# Patient Record
Sex: Male | Born: 1935 | Race: White | Hispanic: No | Marital: Single | Smoking: Former smoker
Health system: Southern US, Community
[De-identification: ages and names within clinical notes are randomized; demographics above are authoritative.]

## PROBLEM LIST (undated history)

## (undated) DIAGNOSIS — Z974 Presence of external hearing-aid: Secondary | ICD-10-CM

## (undated) DIAGNOSIS — G4730 Sleep apnea, unspecified: Secondary | ICD-10-CM

## (undated) DIAGNOSIS — E7800 Pure hypercholesterolemia, unspecified: Secondary | ICD-10-CM

## (undated) DIAGNOSIS — C61 Malignant neoplasm of prostate: Secondary | ICD-10-CM

## (undated) DIAGNOSIS — Z972 Presence of dental prosthetic device (complete) (partial): Secondary | ICD-10-CM

## (undated) DIAGNOSIS — I10 Essential (primary) hypertension: Secondary | ICD-10-CM

## (undated) DIAGNOSIS — C801 Malignant (primary) neoplasm, unspecified: Secondary | ICD-10-CM

## (undated) HISTORY — PX: OTHER SURGICAL HISTORY: SHX169

## (undated) HISTORY — PX: HERNIA REPAIR: SHX51

---

## 1997-03-03 HISTORY — PX: PROSTATECTOMY: SHX69

## 2006-11-27 ENCOUNTER — Ambulatory Visit: Payer: Self-pay | Admitting: Family Medicine

## 2009-03-27 ENCOUNTER — Ambulatory Visit: Payer: Self-pay | Admitting: Otolaryngology

## 2009-11-15 ENCOUNTER — Ambulatory Visit: Payer: Self-pay | Admitting: Family Medicine

## 2009-12-04 ENCOUNTER — Ambulatory Visit: Payer: Self-pay | Admitting: Otolaryngology

## 2011-02-12 ENCOUNTER — Ambulatory Visit: Payer: Self-pay | Admitting: Otolaryngology

## 2011-09-23 DIAGNOSIS — I10 Essential (primary) hypertension: Secondary | ICD-10-CM | POA: Insufficient documentation

## 2011-11-29 ENCOUNTER — Ambulatory Visit: Payer: Self-pay | Admitting: Medical

## 2011-12-02 ENCOUNTER — Ambulatory Visit: Payer: Self-pay | Admitting: Internal Medicine

## 2011-12-09 ENCOUNTER — Ambulatory Visit: Payer: Self-pay | Admitting: Internal Medicine

## 2012-01-01 ENCOUNTER — Ambulatory Visit: Payer: Self-pay | Admitting: Internal Medicine

## 2012-07-03 ENCOUNTER — Ambulatory Visit: Payer: Self-pay

## 2012-09-06 ENCOUNTER — Ambulatory Visit: Payer: Self-pay | Admitting: Otolaryngology

## 2013-03-04 ENCOUNTER — Ambulatory Visit: Payer: Self-pay | Admitting: Pediatrics

## 2013-10-05 ENCOUNTER — Ambulatory Visit: Payer: Self-pay | Admitting: Ophthalmology

## 2013-11-16 ENCOUNTER — Ambulatory Visit: Payer: Self-pay | Admitting: Ophthalmology

## 2014-03-03 HISTORY — PX: CARDIAC CATHETERIZATION: SHX172

## 2014-05-15 DIAGNOSIS — F419 Anxiety disorder, unspecified: Secondary | ICD-10-CM | POA: Insufficient documentation

## 2014-05-18 DIAGNOSIS — I25119 Atherosclerotic heart disease of native coronary artery with unspecified angina pectoris: Secondary | ICD-10-CM | POA: Insufficient documentation

## 2014-07-13 ENCOUNTER — Telehealth: Payer: Self-pay | Admitting: *Deleted

## 2014-07-13 NOTE — Telephone Encounter (Signed)
Left vm that we got an order for Cardiac Rehab that his MD wanted him to have. (Need to discuss with patient if interested and get more details of dx chest pain to see if insurance will pay for CR).

## 2014-09-11 DIAGNOSIS — R0789 Other chest pain: Secondary | ICD-10-CM | POA: Insufficient documentation

## 2015-02-13 ENCOUNTER — Ambulatory Visit
Admission: EM | Admit: 2015-02-13 | Discharge: 2015-02-13 | Disposition: A | Discharge status: Home / Self Care | Payer: Medicare HMO | Attending: Family Medicine | Admitting: Family Medicine

## 2015-02-13 DIAGNOSIS — J069 Acute upper respiratory infection, unspecified: Secondary | ICD-10-CM

## 2015-02-13 HISTORY — DX: Malignant neoplasm of prostate: C61

## 2015-02-13 HISTORY — DX: Malignant (primary) neoplasm, unspecified: C80.1

## 2015-02-13 HISTORY — DX: Essential (primary) hypertension: I10

## 2015-02-13 HISTORY — DX: Pure hypercholesterolemia, unspecified: E78.00

## 2015-02-13 MED ORDER — AZITHROMYCIN 250 MG PO TABS: ORAL_TABLET | ORAL | Status: DC

## 2015-02-13 MED ORDER — HYDROCOD POLST-CPM POLST ER 10-8 MG/5ML PO SUER: 2.5000 mL | Freq: Every evening | ORAL | Status: DC | PRN

## 2015-02-13 NOTE — Discharge Instructions (Signed)
Cool Mist Vaporizers Vaporizers may help relieve the symptoms of a cough and cold. They add moisture to the air, which helps mucus to become thinner and less sticky. This makes it easier to breathe and cough up secretions. Cool mist vaporizers do not cause serious burns like hot mist vaporizers, which may also be called steamers or humidifiers. Vaporizers have not been proven to help with colds. You should not use a vaporizer if you are allergic to mold. HOME CARE INSTRUCTIONS  Follow the package instructions for the vaporizer.  Do not use anything other than distilled water in the vaporizer.  Do not run the vaporizer all of the time. This can cause mold or bacteria to grow in the vaporizer.  Clean the vaporizer after each time it is used.  Clean and dry the vaporizer well before storing it.  Stop using the vaporizer if worsening respiratory symptoms develop.   This information is not intended to replace advice given to you by your health care provider. Make sure you discuss any questions you have with your health care provider.   Document Released: 11/15/2003 Document Revised: 02/22/2013 Document Reviewed: 07/07/2012 Elsevier Interactive Patient Education 2016 Elsevier Inc.  Upper Respiratory Infection, Adult Most upper respiratory infections (URIs) are a viral infection of the air passages leading to the lungs. A URI affects the nose, throat, and upper air passages. The most common type of URI is nasopharyngitis and is typically referred to as "the common cold." URIs run their course and usually go away on their own. Most of the time, a URI does not require medical attention, but sometimes a bacterial infection in the upper airways can follow a viral infection. This is called a secondary infection. Sinus and middle ear infections are common types of secondary upper respiratory infections. Bacterial pneumonia can also complicate a URI. A URI can worsen asthma and chronic obstructive  pulmonary disease (COPD). Sometimes, these complications can require emergency medical care and may be life threatening.  CAUSES Almost all URIs are caused by viruses. A virus is a type of germ and can spread from one person to another.  RISKS FACTORS You may be at risk for a URI if:   You smoke.   You have chronic heart or lung disease.  You have a weakened defense (immune) system.   You are very young or very old.   You have nasal allergies or asthma.  You work in crowded or poorly ventilated areas.  You work in health care facilities or schools. SIGNS AND SYMPTOMS  Symptoms typically develop 2-3 days after you come in contact with a cold virus. Most viral URIs last 7-10 days. However, viral URIs from the influenza virus (flu virus) can last 14-18 days and are typically more severe. Symptoms may include:   Runny or stuffy (congested) nose.   Sneezing.   Cough.   Sore throat.   Headache.   Fatigue.   Fever.   Loss of appetite.   Pain in your forehead, behind your eyes, and over your cheekbones (sinus pain).  Muscle aches.  DIAGNOSIS  Your health care provider may diagnose a URI by:  Physical exam.  Tests to check that your symptoms are not due to another condition such as:  Strep throat.  Sinusitis.  Pneumonia.  Asthma. TREATMENT  A URI goes away on its own with time. It cannot be cured with medicines, but medicines may be prescribed or recommended to relieve symptoms. Medicines may help:  Reduce your fever.  Reduce  your cough.  Relieve nasal congestion. HOME CARE INSTRUCTIONS   Take medicines only as directed by your health care provider.   Gargle warm saltwater or take cough drops to comfort your throat as directed by your health care provider.  Use a warm mist humidifier or inhale steam from a shower to increase air moisture. This may make it easier to breathe.  Drink enough fluid to keep your urine clear or pale yellow.   Eat  soups and other clear broths and maintain good nutrition.   Rest as needed.   Return to work when your temperature has returned to normal or as your health care provider advises. You may need to stay home longer to avoid infecting others. You can also use a face mask and careful hand washing to prevent spread of the virus.  Increase the usage of your inhaler if you have asthma.   Do not use any tobacco products, including cigarettes, chewing tobacco, or electronic cigarettes. If you need help quitting, ask your health care provider. PREVENTION  The best way to protect yourself from getting a cold is to practice good hygiene.   Avoid oral or hand contact with people with cold symptoms.   Wash your hands often if contact occurs.  There is no clear evidence that vitamin C, vitamin E, echinacea, or exercise reduces the chance of developing a cold. However, it is always recommended to get plenty of rest, exercise, and practice good nutrition.  SEEK MEDICAL CARE IF:   You are getting worse rather than better.   Your symptoms are not controlled by medicine.   You have chills.  You have worsening shortness of breath.  You have brown or red mucus.  You have yellow or brown nasal discharge.  You have pain in your face, especially when you bend forward.  You have a fever.  You have swollen neck glands.  You have pain while swallowing.  You have white areas in the back of your throat. SEEK IMMEDIATE MEDICAL CARE IF:   You have severe or persistent:  Headache.  Ear pain.  Sinus pain.  Chest pain.  You have chronic lung disease and any of the following:  Wheezing.  Prolonged cough.  Coughing up blood.  A change in your usual mucus.  You have a stiff neck.  You have changes in your:  Vision.  Hearing.  Thinking.  Mood. MAKE SURE YOU:   Understand these instructions.  Will watch your condition.  Will get help right away if you are not doing well or  get worse.   This information is not intended to replace advice given to you by your health care provider. Make sure you discuss any questions you have with your health care provider.   Document Released: 08/13/2000 Document Revised: 07/04/2014 Document Reviewed: 05/25/2013 Elsevier Interactive Patient Education 2016 Elsevier Inc.  Cough, Adult Coughing is a reflex that clears your throat and your airways. Coughing helps to heal and protect your lungs. It is normal to cough occasionally, but a cough that happens with other symptoms or lasts a long time may be a sign of a condition that needs treatment. A cough may last only 2-3 weeks (acute), or it may last longer than 8 weeks (chronic). CAUSES Coughing is commonly caused by:  Breathing in substances that irritate your lungs.  A viral or bacterial respiratory infection.  Allergies.  Asthma.  Postnasal drip.  Smoking.  Acid backing up from the stomach into the esophagus (gastroesophageal reflux).  Certain medicines.  Chronic lung problems, including COPD (or rarely, lung cancer).  Other medical conditions such as heart failure. HOME CARE INSTRUCTIONS  Pay attention to any changes in your symptoms. Take these actions to help with your discomfort:  Take medicines only as told by your health care provider.  If you were prescribed an antibiotic medicine, take it as told by your health care provider. Do not stop taking the antibiotic even if you start to feel better.  Talk with your health care provider before you take a cough suppressant medicine.  Drink enough fluid to keep your urine clear or pale yellow.  If the air is dry, use a cold steam vaporizer or humidifier in your bedroom or your home to help loosen secretions.  Avoid anything that causes you to cough at work or at home.  If your cough is worse at night, try sleeping in a semi-upright position.  Avoid cigarette smoke. If you smoke, quit smoking. If you need help  quitting, ask your health care provider.  Avoid caffeine.  Avoid alcohol.  Rest as needed. SEEK MEDICAL CARE IF:   You have new symptoms.  You cough up pus.  Your cough does not get better after 2-3 weeks, or your cough gets worse.  You cannot control your cough with suppressant medicines and you are losing sleep.  You develop pain that is getting worse or pain that is not controlled with pain medicines.  You have a fever.  You have unexplained weight loss.  You have night sweats. SEEK IMMEDIATE MEDICAL CARE IF:  You cough up blood.  You have difficulty breathing.  Your heartbeat is very fast.   This information is not intended to replace advice given to you by your health care provider. Make sure you discuss any questions you have with your health care provider.   Document Released: 08/16/2010 Document Revised: 11/08/2014 Document Reviewed: 04/26/2014 Elsevier Interactive Patient Education [employer].

## 2015-02-13 NOTE — ED Provider Notes (Signed)
CSN: [id number]     Arrival date & time 02/13/15  1906 History   First MD Initiated Contact with Patient 02/13/15 1951     Chief Complaint  Patient presents with  . URI   (Consider location/radiation/quality/duration/timing/severity/associated sxs/prior Treatment) HPI   This 80 year old male who presents with a throat that started about 2 days ago but has gotten better although his left with some scratchy tickly feeling. He now has a cough that is persistent first was dry but is now become productive with yellow-green sputum. He's had no fever but has had chills. Coronary disease and is status post PTCA stenting. Afebrile today pulse is 64 respirations 16 blood pressure 130/76 O2 sat 98% on room air is a nonsmoker  Past Medical History  Diagnosis Date  . Hypertension   . Hypercholesteremia   . Cancer ([place])   . Prostate cancer [facility])    Past Surgical History  Procedure Laterality Date  . Prostatectomy  1999  . Hernia repair    . Cardiac stents     Family History  Problem Relation Age of Onset  . Heart attack Mother    Social History  Substance Use Topics  . Smoking status: Former Research scientist (life sciences)  . Smokeless tobacco: None  . Alcohol Use: No    Review of Systems  Constitutional: Positive for chills and activity change. Negative for fever and fatigue.  HENT: Positive for congestion, postnasal drip, rhinorrhea, sinus pressure and sore throat.   Respiratory: Positive for cough. Negative for shortness of breath, wheezing and stridor.   All other systems reviewed and are negative.   Allergies  Review of patient's allergies indicates no known allergies.  Home Medications   Prior to Admission medications   Medication Sig Start Date End Date Taking? Authorizing Provider  atorvastatin (LIPITOR) 40 MG tablet Take 40 mg by mouth daily.   Yes Historical Provider, MD  clopidogrel (PLAVIX) 75 MG tablet Take 75 mg by mouth daily.   Yes Historical Provider, MD  lisinopril (PRINIVIL,ZESTRIL)  10 MG tablet Take 10 mg by mouth daily.   Yes Historical Provider, MD  metoprolol tartrate (LOPRESSOR) 25 MG tablet Take 25 mg by mouth 2 (two) times daily.   Yes Historical Provider, MD  azithromycin (ZITHROMAX Z-PAK) 250 MG tablet Use as per package instructions 02/13/15   Lorin Picket, PA-C  chlorpheniramine-HYDROcodone [facility]) 10-8 MG/5ML SUER Take 2.5 mLs by mouth at bedtime as needed for cough. 02/13/15   Lorin Picket, PA-C   Meds Ordered and Administered this Visit  Medications - No data to display  BP 138/76 mmHg  Pulse 64  Temp(Src) 98.1 F (36.7 C) (Tympanic)  Resp 16  Ht 5\' 8"  (1.727 m)  Wt 170 lb (77.111 kg)  BMI 25.85 kg/m2  SpO2 98% No data found.   Physical Exam  Constitutional: He is oriented to person, place, and time. He appears well-developed and well-nourished. No distress.  HENT:  Head: Normocephalic and atraumatic.  Right ear is very sclerotic posteriorly and inferiorly  Eyes: Conjunctivae are normal. Pupils are equal, round, and reactive to light.  Neck: Normal range of motion. Neck supple.  Pulmonary/Chest: Effort normal and breath sounds normal. No stridor. No respiratory distress. He has no wheezes. He has no rales.  Musculoskeletal: Normal range of motion. He exhibits no edema or tenderness.  Lymphadenopathy:    He has no cervical adenopathy.  Neurological: He is alert and oriented to person, place, and time.  Skin: Skin is warm and  dry. He is not diaphoretic.  Psychiatric: He has a normal mood and affect. His behavior is normal. Judgment and thought content normal.  Nursing note and vitals reviewed.   ED Course  Procedures (including critical care time)  Labs Review Labs Reviewed - No data to display  Imaging Review No results found.   Visual Acuity Review  Right Eye Distance:   Left Eye Distance:   Bilateral Distance:    Right Eye Near:   Left Eye Near:    Bilateral Near:         MDM   1. URI, acute     Discharge Medication List as of 02/13/2015  8:06 PM    START taking these medications   Details  azithromycin (ZITHROMAX Z-PAK) 250 MG tablet Use as per package instructions, Print    chlorpheniramine-HYDROcodone (TUSSIONEX PENNKINETIC ER) 10-8 MG/5ML SUER Take 2.5 mLs by mouth at bedtime as needed for cough., Starting 02/13/2015, Until Discontinued, Print      Plan: 1. Diagnosis reviewed with patient 2. rx as per orders; risks, benefits, potential side effects reviewed with patient 3. Recommend supportive treatment with fluids and rest. Cardio [place] antibiotic at this point time because of his age as well as his coronary disease.  4. F/u in emergency room if her has any increase in his productive cough fever chills night sweats   Lorin Picket, PA-C 02/13/15 2019

## 2015-02-13 NOTE — ED Notes (Signed)
Started 2 days ago with sore throat which has now subsided, cough which has now become productive yellow/green. Denies fever

## 2015-07-17 DIAGNOSIS — Z85828 Personal history of other malignant neoplasm of skin: Secondary | ICD-10-CM | POA: Insufficient documentation

## 2016-04-17 DIAGNOSIS — Z9181 History of falling: Secondary | ICD-10-CM | POA: Insufficient documentation

## 2016-08-13 ENCOUNTER — Ambulatory Visit
Admission: EM | Admit: 2016-08-13 | Discharge: 2016-08-13 | Disposition: A | Discharge status: Home / Self Care | Payer: Medicare HMO | Attending: Family Medicine | Admitting: Family Medicine

## 2016-08-13 ENCOUNTER — Ambulatory Visit (INDEPENDENT_AMBULATORY_CARE_PROVIDER_SITE_OTHER): Payer: Medicare HMO

## 2016-08-13 ENCOUNTER — Encounter: Payer: Self-pay | Admitting: *Deleted

## 2016-08-13 DIAGNOSIS — M25511 Pain in right shoulder: Secondary | ICD-10-CM | POA: Diagnosis not present

## 2016-08-13 DIAGNOSIS — S43101A Unspecified dislocation of right acromioclavicular joint, initial encounter: Secondary | ICD-10-CM

## 2016-08-13 NOTE — Discharge Instructions (Signed)
Rest. Ice. Use sling for support. Stretch as discussed.   Follow up with orthopedic this week as discussed.   Follow up with your primary care physician this week as needed. Return to Urgent care for new or worsening concerns.

## 2016-08-13 NOTE — ED Provider Notes (Signed)
[facility] ____________________________________________  Time seen: Approximately 12:29 PM  I have reviewed the triage vital signs and the nursing notes.   HISTORY  Chief Complaint Shoulder Injury   HPI Nathan Daniels is a 82 y.o. male  presenting for evaluation of right shoulder pain post injury that occurred just prior to arrival. Patient states that he was moving things around at a club he attends, tripped causing him to fall to his right knee and hit his right shoulder against a beam. States only right knee hit the ground. Did not fall completely to the ground. Denies head injury or loss consciousness. Denies pain to his knee. Denies lower extremity pain. states initially he didn't have any pain to his right shoulder, and then noticed there is a knot present and he has mild pain only when directly pressing on the knot. Reports he still has full range of motion present. Denies any paresthesias, decrease hand grips or extremity pain or weakness. Denies complaints prior to fall today. States only fell due to tripping. Denies any neck or back pain or injury. Reports otherwise feels well. States minimal to mild pain at this time. No alleviating factors and prior to arrival.  Denies chest pain, shortness of breath, weakness, dizziness, abdominal pain, extremity swelling or rash. Denies recent sickness. Denies recent antibiotic use. Takes plavix.    Past Medical History:  Diagnosis Date  . Cancer ([place])   . Hypercholesteremia   . Hypertension   . Prostate cancer ([place])   Chronic cervical radicular pain-denies currently  There are no active problems to display for this patient.   Past Surgical History:  Procedure Laterality Date  . cardiac stents    . HERNIA REPAIR    . PROSTATECTOMY  1999     No current facility-administered medications for this encounter.   Current Outpatient Prescriptions:  .  atorvastatin (LIPITOR) 40 MG tablet, Take 40 mg by mouth daily., Disp:  , Rfl:  .  clopidogrel (PLAVIX) 75 MG tablet, Take 75 mg by mouth daily., Disp: , Rfl:  .  lisinopril (PRINIVIL,ZESTRIL) 10 MG tablet, Take 10 mg by mouth daily., Disp: , Rfl:  .  azithromycin (ZITHROMAX Z-PAK) 250 MG tablet, Use as per package instructions, Disp: 1 each, Rfl: 0 .  chlorpheniramine-HYDROcodone (TUSSIONEX PENNKINETIC ER) 10-8 MG/5ML SUER, Take 2.5 mLs by mouth at bedtime as needed for cough., Disp: 60 mL, Rfl: 0 .  metoprolol tartrate (LOPRESSOR) 25 MG tablet, Take 25 mg by mouth 2 (two) times daily., Disp: , Rfl:   Allergies Patient has no known allergies.  Family History  Problem Relation Age of Onset  . Heart attack Mother     Social History Social History  Substance Use Topics  . Smoking status: Former Research scientist (life sciences)  . Smokeless tobacco: Never Used  . Alcohol use No    Review of Systems Constitutional: No fever/chills Eyes: No visual changes. Cardiovascular: Denies chest pain. Respiratory: Denies shortness of breath. Gastrointestinal: No abdominal pain.  Musculoskeletal: Negative for back pain. As above. Skin: Negative for rash.   ____________________________________________   PHYSICAL EXAM:  VITAL SIGNS: ED Triage Vitals  Enc Vitals Group     BP 08/13/16 1117 (!) 141/92     Pulse Rate 08/13/16 1117 100     Resp 08/13/16 1117 16     Temp 08/13/16 1117 98 F (36.7 C)     Temp Source 08/13/16 1117 Oral     SpO2 08/13/16 1117 98 %     Weight 08/13/16  1118 170 lb (77.1 kg)     Height 08/13/16 1118 5\' 7"  (1.702 m)     Head Circumference --      Peak Flow --      Pain Score 08/13/16 1118 2     Pain Loc --      Pain Edu? --      Excl. in [place]? --     Constitutional: Alert and oriented. Well appearing and in no acute distress. ENT      Head: Normocephalic and atraumatic. Cardiovascular: Normal rate, regular rhythm. Grossly normal heart sounds.  Good peripheral circulation. Respiratory: Normal respiratory effort without tachypnea nor retractions. Breath  sounds are clear and equal bilaterally. No wheezes, rales, rhonchi. Musculoskeletal:  No midline cervical, thoracic or lumbar tenderness to palpation. Bilateral Distal radial pulses equal and easily palpated. ambulatory in room with steady gait. Changes positions quickly. No chest or rib tenderness to palpation.      Right lower leg:  No tenderness or edema.      Left lower leg:  No tenderness or edema.  Except : Mild point tenderness at right [facility] joint, with minimal ecchymosis, mild to moderate localized swelling, no bleeding, skin intact, no posterior scapular tenderness, no medial or mid clavicular tenderness, no humeral tenderness, right upper extremity able to abduct past 90, no pain with abduction, mild pain with empty can test, bilateral hand grips strong and equal, no motor or tendon deficit noted to bilateral hands.  Neurologic:  Normal speech and language. No gross focal neurologic deficits are appreciated. Speech is normal. No gait instability.  Skin:  Skin is warm, dry. Psychiatric: Mood and affect are normal. Speech and behavior are normal. Patient exhibits appropriate insight and judgment   ___________________________________________   LABS (all labs ordered are listed, but only abnormal results are displayed)  Labs Reviewed - No data to display ____________________________________________  RADIOLOGY  Dg Shoulder Right  Result Date: 08/13/2016 CLINICAL DATA:  Status post fall with right shoulder injury. The patient is complaining of discomfort over the [facility] joint. EXAM: RIGHT SHOULDER - 2+ VIEW COMPARISON:  Chest x-ray of March 04, 2013 FINDINGS: There is high-riding of the distal clavicle with respect to the acromion. There is calcification within the [facility] joint space that may lie within the soft tissues or be related to an avulsion fracture. There is narrowing of the subacromial subdeltoid space. There is mild degenerative spurring of the bony glenoid but the joint space is well  maintained. No acute humeral or scapular fracture is observed. The observed portions of the upper right ribs are normal. IMPRESSION: Degenerative changes of the right shoulder. Degenerative and possibly post traumatic change of the [facility] joint. There may be disruption of the acromioclavicular ligament and an avulsion from the distal clavicle. A bilateral AC joint series without and with weights is recommended. Electronically Signed   By: David  Martinique M.D.   On: 08/13/2016 12:06   Dg Ac Joints  Result Date: 08/13/2016 CLINICAL DATA:  Injury of the right shoulder this morning with pain centered over the [facility] joint. EXAM: LEFT ACROMIOCLAVICULAR JOINTS COMPARISON:  Right shoulder series of today's date FINDINGS: The distal right clavicle is superior to the acromion by approximately 1 shaft width. It does not change position between the with weight and without weight images. There are soft tissue calcifications in the [facility] joint space. On the left the [facility] joint appears intact. There are calcifications within the soft tissues of the [facility] joint. IMPRESSION: High-riding distal right  clavicle suggests disruption of the [facility] joint. This may be acute or chronic. No definite acute fracture is observed. Soft tissue calcification within the right [facility] joint is similar to that seen on the left and is likely degenerative or related to old trauma. Electronically Signed   By: David  Martinique M.D.   On: 08/13/2016 12:46   ____________________________________________   PROCEDURES Procedures    INITIAL IMPRESSION / ASSESSMENT AND PLAN / ED COURSE  Pertinent labs & imaging results that were available during my care of the patient were reviewed by me and considered in my medical decision making (see chart for details).  Very well-appearing patient. No acute distress. Presents for evaluation of right shoulder pain post mechanical injury that occurred prior to arrival. Tenderness with deformity appearing ac joint. No paresthesias,  weakness or motor or tendon deficits noted. Right shoulder x-ray per radiologist, degenerative changes of the right shoulder, degeneration and possible posttraumatic changes of the ac joint, may be a disruption of the ac ligament and avulsion of the distal clavicle. Will evaluate bilateral ac joint series.  AC joint series per radiologist, high riding distal right clavicle suggesting disruption of the ac joint, may be acute or chronic, no definite acute fracture observed, soft tissue calcification within the right ac joint is similar to left. Discussed in detail with patient suspect right ac joint separation, unclear type, suspect type III. Discussed in detail with patient recommend sling, rest, ice and supportive care. Discussed some gradual and easy pendulum exercises. Encouraged orthopedic follow-up this week. Information given. Xray cd given. Sling applied.   Discussed follow up with Primary care physician this week as needed. Discussed follow up and return parameters including no resolution or any worsening concerns. Patient verbalized understanding and agreed to plan.   ____________________________________________   FINAL CLINICAL IMPRESSION(S) / ED DIAGNOSES  Final diagnoses:  Separation of right acromioclavicular joint, initial encounter  Arthralgia of right acromioclavicular joint     Discharge Medication List as of 08/13/2016  1:04 PM      Note: This dictation was prepared with Dragon dictation along with smaller phrase technology. Any transcriptional errors that result from this process are unintentional.           Marylene Land, NP 08/13/16 1321

## 2016-08-13 NOTE — ED Triage Notes (Signed)
Patient injured his right shoulder when he hit it against a beam while moving objects around.

## 2017-08-09 ENCOUNTER — Encounter: Payer: Self-pay | Admitting: Emergency Medicine

## 2017-08-09 ENCOUNTER — Other Ambulatory Visit: Payer: Self-pay

## 2017-08-09 ENCOUNTER — Emergency Department
Admission: EM | Admit: 2017-08-09 | Discharge: 2017-08-09 | Disposition: A | Discharge status: Home / Self Care | Payer: Medicare HMO | Attending: Emergency Medicine | Admitting: Emergency Medicine

## 2017-08-09 DIAGNOSIS — Z87891 Personal history of nicotine dependence: Secondary | ICD-10-CM | POA: Insufficient documentation

## 2017-08-09 DIAGNOSIS — W260XXA Contact with knife, initial encounter: Secondary | ICD-10-CM | POA: Diagnosis not present

## 2017-08-09 DIAGNOSIS — Z79899 Other long term (current) drug therapy: Secondary | ICD-10-CM | POA: Diagnosis not present

## 2017-08-09 DIAGNOSIS — Y9389 Activity, other specified: Secondary | ICD-10-CM | POA: Diagnosis not present

## 2017-08-09 DIAGNOSIS — S61412A Laceration without foreign body of left hand, initial encounter: Secondary | ICD-10-CM | POA: Diagnosis present

## 2017-08-09 DIAGNOSIS — S61411A Laceration without foreign body of right hand, initial encounter: Secondary | ICD-10-CM

## 2017-08-09 DIAGNOSIS — Y999 Unspecified external cause status: Secondary | ICD-10-CM | POA: Insufficient documentation

## 2017-08-09 DIAGNOSIS — Y929 Unspecified place or not applicable: Secondary | ICD-10-CM | POA: Insufficient documentation

## 2017-08-09 DIAGNOSIS — Z23 Encounter for immunization: Secondary | ICD-10-CM | POA: Diagnosis not present

## 2017-08-09 DIAGNOSIS — I10 Essential (primary) hypertension: Secondary | ICD-10-CM | POA: Insufficient documentation

## 2017-08-09 MED ORDER — TETANUS-DIPHTH-ACELL PERTUSSIS 5-2.5-18.5 LF-MCG/0.5 IM SUSP
0.5000 mL | Freq: Once | INTRAMUSCULAR | Status: AC
  Administered 2017-08-09: 0.5 mL via INTRAMUSCULAR
  Filled 2017-08-09: qty 0.5

## 2017-08-09 MED ORDER — LIDOCAINE HCL (PF) 1 % IJ SOLN
5.0000 mL | Freq: Once | INTRAMUSCULAR | Status: AC
  Administered 2017-08-09: 5 mL
  Filled 2017-08-09: qty 5

## 2017-08-09 NOTE — ED Triage Notes (Signed)
Laceration to right anterior palm.  Approximately 1 inch.  Bleeding controlled.  Cut accidentally with a "razor knife".

## 2017-08-09 NOTE — ED Notes (Signed)
NAD noted at time of D/C. Pt denies questions or concerns. Pt ambulatory to the lobby at this time.  

## 2017-08-09 NOTE — ED Provider Notes (Signed)
[facility] Emergency Department Provider Note  ____________________________________________   First MD Initiated Contact with Patient 08/09/17 [phone]     (approximate)  I have reviewed the triage vital signs and the nursing notes.   HISTORY  Chief Complaint Laceration   HPI Nathan Daniels is a 83 y.o. male is here with a laceration to his left hand.  Patient states that he was working with some epoxy this morning and was using a razor knife.  Patient is unsure but believes that his last tetanus booster has been over 5 years.  He rates his pain as 0/10.   Past Medical History:  Diagnosis Date  . Cancer ([place])   . Hypercholesteremia   . Hypertension   . Prostate cancer ([place])     There are no active problems to display for this patient.   Past Surgical History:  Procedure Laterality Date  . cardiac stents    . HERNIA REPAIR    . PROSTATECTOMY  1999    Prior to Admission medications   Medication Sig Start Date End Date Taking? Authorizing Provider  atorvastatin (LIPITOR) 40 MG tablet Take 40 mg by mouth daily.    [provider]  clopidogrel (PLAVIX) 75 MG tablet Take 75 mg by mouth daily.    [provider]  lisinopril (PRINIVIL,ZESTRIL) 10 MG tablet Take 10 mg by mouth daily.    [provider]  metoprolol tartrate (LOPRESSOR) 25 MG tablet Take 25 mg by mouth 2 (two) times daily.    [provider]    Allergies Patient has no known allergies.  Family History  Problem Relation Age of Onset  . Heart attack Mother     Social History Social History   Tobacco Use  . Smoking status: Former Research scientist (life sciences)  . Smokeless tobacco: Never Used  Substance Use Topics  . Alcohol use: No  . Drug use: Not on file    Review of Systems Constitutional: No fever/chills Cardiovascular: Denies chest pain. Respiratory: Denies shortness of breath. Musculoskeletal: Positive for left hand pain. Skin: Positive for laceration  left hand. Neurological: Negative for headaches. ___________________________________________   PHYSICAL EXAM:  VITAL SIGNS: ED Triage Vitals  Enc Vitals Group     BP 08/09/17 0815 132/75     Pulse Rate 08/09/17 0815 82     Resp 08/09/17 0815 20     Temp 08/09/17 0815 98.7 F (37.1 C)     Temp Source 08/09/17 0815 Oral     SpO2 08/09/17 0815 98 %     Weight 08/09/17 0815 170 lb (77.1 kg)     Height 08/09/17 0815 5\' 7"  (1.702 m)     Head Circumference --      Peak Flow --      Pain Score 08/09/17 0817 0     Pain Loc --      Pain Edu? --      Excl. in [place]? --     Constitutional: Alert and oriented. Well appearing and in no acute distress. Eyes: Conjunctivae are normal.  Head: Atraumatic. Neck: No stridor.   Cardiovascular: Normal rate, regular rhythm. Grossly normal heart sounds.  Good peripheral circulation. Respiratory: Normal respiratory effort.  No retractions. Lungs CTAB. Musculoskeletal: No lower extremity tenderness nor edema.  No joint effusions. Neurologic:  Normal speech and language. No gross focal neurologic deficits are appreciated. No gait instability. Skin:  Skin is warm, dry.  Left hand volar surface over the fifth metacarpal region there is a 3  cm laceration without active bleeding.  No foreign body is noted.  Patient motor function is intact with patient able to flex and extend fully.  Capillary refill is less than 3 seconds.  There is some diffuse sensation changes distal to the laceration but patient is able to determine when he is being touched.   Psychiatric: Mood and affect are normal. Speech and behavior are normal.  ____________________________________________   LABS (all labs ordered are listed, but only abnormal results are displayed)  Labs Reviewed - No data to display   PROCEDURES  Procedure(s) performed:   Marland KitchenMarland KitchenLaceration Repair Date/Time: 08/09/2017 10:55 AM Performed by: Johnn Hai, PA-C Authorized by: Johnn Hai, PA-C    Consent:    Consent obtained:  Verbal   Consent given by:  Patient   Risks discussed:  Retained foreign body, poor cosmetic result and poor wound healing Anesthesia (see MAR for exact dosages):    Anesthesia method:  Local infiltration   Local anesthetic:  Lidocaine 1% w/o epi Laceration details:    Location:  Hand   Hand location:  L palm   Length (cm):  3 Repair type:    Repair type:  Simple Pre-procedure details:    Preparation:  Patient was prepped and draped in usual sterile fashion Exploration:    Hemostasis achieved with:  Direct pressure   Wound exploration: wound explored through full range of motion     Contaminated: no   Treatment:    Area cleansed with:  Betadine and saline   Amount of cleaning:  Standard   Irrigation solution:  Sterile saline   Irrigation method:  Syringe   Visualized foreign bodies/material removed: no   Skin repair:    Repair method:  Sutures   Suture size:  4-0   Suture material:  Nylon   Suture technique:  Simple interrupted and horizontal mattress   Number of sutures:  8 Post-procedure details:    Dressing:  Sterile dressing   Patient tolerance of procedure:  Tolerated well, no immediate complications Comments:     6 simple sutures and 2 mattress sutures    Critical Care performed: No  ____________________________________________   INITIAL IMPRESSION / ASSESSMENT AND PLAN / ED COURSE  As part of my medical decision making, I reviewed the following data within the electronic MEDICAL RECORD NUMBER Notes from prior ED visits and [place] Controlled Substance Database  Patient is to follow-up with his PCP, urgent care or return to the emergency department in 10 to 12 days for suture removal.  He was given instructions on how to take care of the wound and to watch for signs of infection.  He may take Tylenol as needed for pain.  Tetanus immunization was updated.  ____________________________________________   FINAL CLINICAL IMPRESSION(S) /  ED DIAGNOSES  Final diagnoses:  Laceration of right hand without foreign body, initial encounter     ED Discharge Orders    None       Note:  This document was prepared using Dragon voice recognition software and may include unintentional dictation errors.    Johnn Hai, PA-C 08/09/17 1100    Lavonia Drafts, MD 08/09/17 1246

## 2017-08-09 NOTE — Discharge Instructions (Signed)
Follow-up with your primary care provider in 10 to 12 days for suture removal.  Clean area daily with mild soap and water.  Watch for any signs of infection.  Tylenol if needed for pain. Keep area clean and dry.

## 2020-03-06 DIAGNOSIS — R682 Dry mouth, unspecified: Secondary | ICD-10-CM | POA: Diagnosis not present

## 2020-03-06 DIAGNOSIS — R0982 Postnasal drip: Secondary | ICD-10-CM | POA: Diagnosis not present

## 2020-03-06 DIAGNOSIS — G4733 Obstructive sleep apnea (adult) (pediatric): Secondary | ICD-10-CM | POA: Diagnosis not present

## 2020-03-06 DIAGNOSIS — K219 Gastro-esophageal reflux disease without esophagitis: Secondary | ICD-10-CM | POA: Diagnosis not present

## 2020-03-12 DIAGNOSIS — K219 Gastro-esophageal reflux disease without esophagitis: Secondary | ICD-10-CM | POA: Diagnosis not present

## 2020-03-12 DIAGNOSIS — I10 Essential (primary) hypertension: Secondary | ICD-10-CM | POA: Diagnosis not present

## 2020-03-12 DIAGNOSIS — J029 Acute pharyngitis, unspecified: Secondary | ICD-10-CM | POA: Diagnosis not present

## 2020-03-12 DIAGNOSIS — R5383 Other fatigue: Secondary | ICD-10-CM | POA: Diagnosis not present

## 2020-03-12 DIAGNOSIS — I25119 Atherosclerotic heart disease of native coronary artery with unspecified angina pectoris: Secondary | ICD-10-CM | POA: Diagnosis not present

## 2020-07-17 DIAGNOSIS — G4733 Obstructive sleep apnea (adult) (pediatric): Secondary | ICD-10-CM | POA: Diagnosis not present

## 2020-08-17 DIAGNOSIS — G4733 Obstructive sleep apnea (adult) (pediatric): Secondary | ICD-10-CM | POA: Diagnosis not present

## 2020-08-22 DIAGNOSIS — G4700 Insomnia, unspecified: Secondary | ICD-10-CM | POA: Diagnosis not present

## 2020-08-22 DIAGNOSIS — G4733 Obstructive sleep apnea (adult) (pediatric): Secondary | ICD-10-CM | POA: Diagnosis not present

## 2020-08-28 DIAGNOSIS — R0982 Postnasal drip: Secondary | ICD-10-CM | POA: Diagnosis not present

## 2020-08-28 DIAGNOSIS — K219 Gastro-esophageal reflux disease without esophagitis: Secondary | ICD-10-CM | POA: Diagnosis not present

## 2020-08-28 DIAGNOSIS — J301 Allergic rhinitis due to pollen: Secondary | ICD-10-CM | POA: Diagnosis not present

## 2020-09-16 DIAGNOSIS — G4733 Obstructive sleep apnea (adult) (pediatric): Secondary | ICD-10-CM | POA: Diagnosis not present

## 2020-10-08 DIAGNOSIS — G4733 Obstructive sleep apnea (adult) (pediatric): Secondary | ICD-10-CM | POA: Diagnosis not present

## 2020-10-09 DIAGNOSIS — G4733 Obstructive sleep apnea (adult) (pediatric): Secondary | ICD-10-CM | POA: Diagnosis not present

## 2020-10-17 DIAGNOSIS — G4733 Obstructive sleep apnea (adult) (pediatric): Secondary | ICD-10-CM | POA: Diagnosis not present

## 2020-10-18 DIAGNOSIS — J3489 Other specified disorders of nose and nasal sinuses: Secondary | ICD-10-CM | POA: Diagnosis not present

## 2020-10-18 DIAGNOSIS — G4733 Obstructive sleep apnea (adult) (pediatric): Secondary | ICD-10-CM | POA: Diagnosis not present

## 2020-10-18 DIAGNOSIS — J343 Hypertrophy of nasal turbinates: Secondary | ICD-10-CM | POA: Diagnosis not present

## 2020-10-18 DIAGNOSIS — K219 Gastro-esophageal reflux disease without esophagitis: Secondary | ICD-10-CM | POA: Diagnosis not present

## 2020-11-08 DIAGNOSIS — J3489 Other specified disorders of nose and nasal sinuses: Secondary | ICD-10-CM | POA: Diagnosis not present

## 2020-11-17 DIAGNOSIS — G4733 Obstructive sleep apnea (adult) (pediatric): Secondary | ICD-10-CM | POA: Diagnosis not present

## 2020-11-30 DIAGNOSIS — J3489 Other specified disorders of nose and nasal sinuses: Secondary | ICD-10-CM | POA: Diagnosis not present

## 2020-12-12 DIAGNOSIS — G4733 Obstructive sleep apnea (adult) (pediatric): Secondary | ICD-10-CM | POA: Diagnosis not present

## 2020-12-17 DIAGNOSIS — G4733 Obstructive sleep apnea (adult) (pediatric): Secondary | ICD-10-CM | POA: Diagnosis not present

## 2021-01-01 DIAGNOSIS — K219 Gastro-esophageal reflux disease without esophagitis: Secondary | ICD-10-CM | POA: Diagnosis not present

## 2021-01-01 DIAGNOSIS — J3489 Other specified disorders of nose and nasal sinuses: Secondary | ICD-10-CM | POA: Diagnosis not present

## 2021-01-17 DIAGNOSIS — G4733 Obstructive sleep apnea (adult) (pediatric): Secondary | ICD-10-CM | POA: Diagnosis not present

## 2021-02-16 DIAGNOSIS — G4733 Obstructive sleep apnea (adult) (pediatric): Secondary | ICD-10-CM | POA: Diagnosis not present

## 2021-02-27 ENCOUNTER — Ambulatory Visit (INDEPENDENT_AMBULATORY_CARE_PROVIDER_SITE_OTHER): Payer: Medicare HMO | Admitting: Gastroenterology

## 2021-02-27 ENCOUNTER — Other Ambulatory Visit: Payer: Self-pay

## 2021-02-27 ENCOUNTER — Encounter: Payer: Self-pay | Admitting: Gastroenterology

## 2021-02-27 VITALS — BP 136/87 | HR 94 | Temp 98.7°F | Ht 67.0 in | Wt 166.4 lb

## 2021-02-27 DIAGNOSIS — F32A Depression, unspecified: Secondary | ICD-10-CM | POA: Insufficient documentation

## 2021-02-27 DIAGNOSIS — M1990 Unspecified osteoarthritis, unspecified site: Secondary | ICD-10-CM | POA: Insufficient documentation

## 2021-02-27 DIAGNOSIS — H9313 Tinnitus, bilateral: Secondary | ICD-10-CM | POA: Insufficient documentation

## 2021-02-27 DIAGNOSIS — K219 Gastro-esophageal reflux disease without esophagitis: Secondary | ICD-10-CM

## 2021-02-27 DIAGNOSIS — R0989 Other specified symptoms and signs involving the circulatory and respiratory systems: Secondary | ICD-10-CM | POA: Diagnosis not present

## 2021-02-27 DIAGNOSIS — R09A2 Foreign body sensation, throat: Secondary | ICD-10-CM

## 2021-02-27 DIAGNOSIS — C61 Malignant neoplasm of prostate: Secondary | ICD-10-CM | POA: Insufficient documentation

## 2021-02-27 DIAGNOSIS — C4490 Unspecified malignant neoplasm of skin, unspecified: Secondary | ICD-10-CM | POA: Insufficient documentation

## 2021-02-27 DIAGNOSIS — Z461 Encounter for fitting and adjustment of hearing aid: Secondary | ICD-10-CM | POA: Insufficient documentation

## 2021-02-27 DIAGNOSIS — H9193 Unspecified hearing loss, bilateral: Secondary | ICD-10-CM | POA: Insufficient documentation

## 2021-02-27 NOTE — Progress Notes (Signed)
Gastroenterology Consultation  Referring Provider:     Margaretha Sheffield, MD Primary Care Physician:  Marygrace Drought, MD Primary Gastroenterologist:  Dr. Allen Norris     Reason for Consultation:     GERD        HPI:   Nathan Daniels is a 86 y.o. y/o male referred for consultation & management of GERD by Dr. Marygrace Drought, MD.  This patient was sent to me for evaluation by ENT.  The patient had previously been seen for colonoscopy in 2017 by Dr. Rayann Heman. The patient reports that he feels something stuck in his throat although ENT did not see anything in his throat.  He also reports that every morning he wakes up and spits up bad tasting liquid.  He comes in today with a clear coffee cup with about 1/8 of it filled with clear fluid with a green tinge.  There is no report of any unexplained weight loss fevers chills nausea vomiting.  The patient also denies any dysphagia.  He states that he thinks there is an issue with his esophagus since he feels that he has been having voice changes and a lump in his throat.  The patient has been on omeprazole for 6 months and states that he does not notice any difference with this.  He denies having to bring up any food or material of any fluid he brings today in the cup.  He states that bringing up of the fluid has been going on for many years  Past Medical History:  Diagnosis Date   Cancer ([place])    Hypercholesteremia    Hypertension    Prostate cancer [facility])     Past Surgical History:  Procedure Laterality Date   cardiac stents     [place]    Prior to Admission medications   Medication Sig Start Date End Date Taking? Authorizing Provider  atorvastatin (LIPITOR) 40 MG tablet Take 40 mg by mouth daily.    [provider]  clopidogrel (PLAVIX) 75 MG tablet Take 75 mg by mouth daily.    [provider]  lisinopril (PRINIVIL,ZESTRIL) 10 MG tablet Take 10 mg by mouth daily.    [provider]  metoprolol  tartrate (LOPRESSOR) 25 MG tablet Take 25 mg by mouth 2 (two) times daily.    [provider]    Family History  Problem Relation Age of Onset   Heart attack Mother      Social History   Tobacco Use   Smoking status: Former   Smokeless tobacco: Never  Substance Use Topics   Alcohol use: No    Allergies as of 02/27/2021   (No Known Allergies)    Review of Systems:    All systems reviewed and negative except where noted in HPI.   Physical Exam:  There were no vitals taken for this visit. No LMP for male patient. General:   Alert,  Well-developed, well-nourished, pleasant and cooperative in NAD Head:  Normocephalic and atraumatic. Eyes:  Sclera clear, no icterus.   Conjunctiva pink. Ears:  Normal auditory acuity. Neck:  Supple; no masses or thyromegaly. Lungs:  Respirations even and unlabored.  Clear throughout to auscultation.   No wheezes, crackles, or rhonchi. No acute distress. Heart:  Regular rate and rhythm; no murmurs, clicks, rubs, or gallops. Abdomen:  Normal bowel sounds.  No bruits.  Soft, non-tender and non-distended without masses, hepatosplenomegaly or hernias noted.  No guarding or rebound tenderness.  Negative Carnett sign.   Rectal:  Deferred.  Pulses:  Normal pulses noted. Extremities:  No clubbing or edema.  No cyanosis. Neurologic:  Alert and oriented x3;  grossly normal neurologically. Skin:  Intact without significant lesions or rashes.  No jaundice. Lymph Nodes:  No significant cervical adenopathy. Psych:  Alert and cooperative. Normal mood and affect.  Imaging Studies: No results found.  Assessment and Plan:   Nathan Daniels is a 86 y.o. y/o male who comes in today with a history of a globus sensation in his throat.  The patient was evaluated by ENT without any findings.  The patient also reported a change in his voice that has not been helped by a PPI.  The patient is concerned that there may be something going on in his esophagus as a  cause of fullness in his throat.  The patient will be set up for a barium swallow to look for any abnormalities in the esophagus en lieu of doing any endoscopic procedures on this 86 year old gentleman.  If there is something seen that is abnormal then he may need to undergo an upper endoscopy.  The patient has been explained the plan agrees with it.    Lucilla Lame, MD. Marval Regal    Note: This dictation was prepared with Dragon dictation along with smaller phrase technology. Any transcriptional errors that result from this process are unintentional.

## 2021-03-01 ENCOUNTER — Ambulatory Visit
Admission: RE | Admit: 2021-03-01 | Discharge: 2021-03-01 | Disposition: A | Discharge status: Home / Self Care | Payer: Medicare HMO | Source: Ambulatory Visit | Attending: Gastroenterology | Admitting: Gastroenterology

## 2021-03-01 ENCOUNTER — Other Ambulatory Visit: Payer: Self-pay

## 2021-03-01 DIAGNOSIS — K219 Gastro-esophageal reflux disease without esophagitis: Secondary | ICD-10-CM | POA: Diagnosis not present

## 2021-03-01 DIAGNOSIS — K222 Esophageal obstruction: Secondary | ICD-10-CM | POA: Diagnosis not present

## 2021-03-04 ENCOUNTER — Encounter: Payer: Self-pay | Admitting: Gastroenterology

## 2021-03-06 DIAGNOSIS — I10 Essential (primary) hypertension: Secondary | ICD-10-CM | POA: Diagnosis not present

## 2021-03-06 DIAGNOSIS — I25119 Atherosclerotic heart disease of native coronary artery with unspecified angina pectoris: Secondary | ICD-10-CM | POA: Diagnosis not present

## 2021-03-06 DIAGNOSIS — R5383 Other fatigue: Secondary | ICD-10-CM | POA: Diagnosis not present

## 2021-03-11 DIAGNOSIS — M1812 Unilateral primary osteoarthritis of first carpometacarpal joint, left hand: Secondary | ICD-10-CM | POA: Diagnosis not present

## 2021-03-11 DIAGNOSIS — I25119 Atherosclerotic heart disease of native coronary artery with unspecified angina pectoris: Secondary | ICD-10-CM | POA: Diagnosis not present

## 2021-03-11 DIAGNOSIS — R7871 Abnormal lead level in blood: Secondary | ICD-10-CM | POA: Diagnosis not present

## 2021-03-11 DIAGNOSIS — E785 Hyperlipidemia, unspecified: Secondary | ICD-10-CM | POA: Diagnosis not present

## 2021-03-19 DIAGNOSIS — G4733 Obstructive sleep apnea (adult) (pediatric): Secondary | ICD-10-CM | POA: Diagnosis not present

## 2021-04-18 DIAGNOSIS — J301 Allergic rhinitis due to pollen: Secondary | ICD-10-CM | POA: Diagnosis not present

## 2021-04-18 DIAGNOSIS — J3489 Other specified disorders of nose and nasal sinuses: Secondary | ICD-10-CM | POA: Diagnosis not present

## 2021-04-18 DIAGNOSIS — K219 Gastro-esophageal reflux disease without esophagitis: Secondary | ICD-10-CM | POA: Diagnosis not present

## 2021-04-19 DIAGNOSIS — G4733 Obstructive sleep apnea (adult) (pediatric): Secondary | ICD-10-CM | POA: Diagnosis not present

## 2021-05-14 DIAGNOSIS — G4733 Obstructive sleep apnea (adult) (pediatric): Secondary | ICD-10-CM | POA: Diagnosis not present

## 2021-05-17 DIAGNOSIS — G4733 Obstructive sleep apnea (adult) (pediatric): Secondary | ICD-10-CM | POA: Diagnosis not present

## 2021-06-17 DIAGNOSIS — G4733 Obstructive sleep apnea (adult) (pediatric): Secondary | ICD-10-CM | POA: Diagnosis not present

## 2021-06-20 DIAGNOSIS — G4733 Obstructive sleep apnea (adult) (pediatric): Secondary | ICD-10-CM | POA: Diagnosis not present

## 2021-06-20 DIAGNOSIS — F515 Nightmare disorder: Secondary | ICD-10-CM | POA: Diagnosis not present

## 2021-06-20 DIAGNOSIS — R69 Illness, unspecified: Secondary | ICD-10-CM | POA: Diagnosis not present

## 2021-07-17 DIAGNOSIS — G4733 Obstructive sleep apnea (adult) (pediatric): Secondary | ICD-10-CM | POA: Diagnosis not present

## 2021-10-31 DIAGNOSIS — I2510 Atherosclerotic heart disease of native coronary artery without angina pectoris: Secondary | ICD-10-CM | POA: Diagnosis not present

## 2021-10-31 DIAGNOSIS — R0609 Other forms of dyspnea: Secondary | ICD-10-CM | POA: Diagnosis not present

## 2021-10-31 DIAGNOSIS — R69 Illness, unspecified: Secondary | ICD-10-CM | POA: Diagnosis not present

## 2021-11-05 DIAGNOSIS — R7871 Abnormal lead level in blood: Secondary | ICD-10-CM | POA: Diagnosis not present

## 2021-11-05 DIAGNOSIS — I25119 Atherosclerotic heart disease of native coronary artery with unspecified angina pectoris: Secondary | ICD-10-CM | POA: Diagnosis not present

## 2021-11-05 DIAGNOSIS — R5383 Other fatigue: Secondary | ICD-10-CM | POA: Diagnosis not present

## 2021-11-05 DIAGNOSIS — I10 Essential (primary) hypertension: Secondary | ICD-10-CM | POA: Diagnosis not present

## 2021-11-13 DIAGNOSIS — R0600 Dyspnea, unspecified: Secondary | ICD-10-CM | POA: Diagnosis not present

## 2021-11-13 DIAGNOSIS — I2510 Atherosclerotic heart disease of native coronary artery without angina pectoris: Secondary | ICD-10-CM | POA: Diagnosis not present

## 2021-11-13 DIAGNOSIS — R0609 Other forms of dyspnea: Secondary | ICD-10-CM | POA: Diagnosis not present

## 2021-12-16 DIAGNOSIS — I25119 Atherosclerotic heart disease of native coronary artery with unspecified angina pectoris: Secondary | ICD-10-CM | POA: Diagnosis not present

## 2021-12-16 DIAGNOSIS — R29898 Other symptoms and signs involving the musculoskeletal system: Secondary | ICD-10-CM | POA: Diagnosis not present

## 2021-12-20 DIAGNOSIS — H02105 Unspecified ectropion of left lower eyelid: Secondary | ICD-10-CM | POA: Diagnosis not present

## 2021-12-31 DIAGNOSIS — C44219 Basal cell carcinoma of skin of left ear and external auricular canal: Secondary | ICD-10-CM | POA: Diagnosis not present

## 2021-12-31 DIAGNOSIS — L570 Actinic keratosis: Secondary | ICD-10-CM | POA: Diagnosis not present

## 2021-12-31 DIAGNOSIS — D485 Neoplasm of uncertain behavior of skin: Secondary | ICD-10-CM | POA: Diagnosis not present

## 2022-01-09 DIAGNOSIS — J301 Allergic rhinitis due to pollen: Secondary | ICD-10-CM | POA: Diagnosis not present

## 2022-01-21 DIAGNOSIS — C4491 Basal cell carcinoma of skin, unspecified: Secondary | ICD-10-CM | POA: Diagnosis not present

## 2022-01-21 DIAGNOSIS — L988 Other specified disorders of the skin and subcutaneous tissue: Secondary | ICD-10-CM | POA: Diagnosis not present

## 2022-03-12 DIAGNOSIS — H02135 Senile ectropion of left lower eyelid: Secondary | ICD-10-CM | POA: Diagnosis not present

## 2022-04-17 DIAGNOSIS — J301 Allergic rhinitis due to pollen: Secondary | ICD-10-CM | POA: Diagnosis not present

## 2022-05-06 DIAGNOSIS — R5383 Other fatigue: Secondary | ICD-10-CM | POA: Diagnosis not present

## 2022-05-06 DIAGNOSIS — I10 Essential (primary) hypertension: Secondary | ICD-10-CM | POA: Diagnosis not present

## 2022-05-06 DIAGNOSIS — G479 Sleep disorder, unspecified: Secondary | ICD-10-CM | POA: Diagnosis not present

## 2022-05-06 DIAGNOSIS — I25119 Atherosclerotic heart disease of native coronary artery with unspecified angina pectoris: Secondary | ICD-10-CM | POA: Diagnosis not present

## 2022-05-06 DIAGNOSIS — E7849 Other hyperlipidemia: Secondary | ICD-10-CM | POA: Diagnosis not present

## 2022-05-06 DIAGNOSIS — R69 Illness, unspecified: Secondary | ICD-10-CM | POA: Diagnosis not present

## 2022-05-07 ENCOUNTER — Encounter: Payer: Self-pay | Admitting: Ophthalmology

## 2022-05-08 DIAGNOSIS — J301 Allergic rhinitis due to pollen: Secondary | ICD-10-CM | POA: Diagnosis not present

## 2022-05-08 DIAGNOSIS — K219 Gastro-esophageal reflux disease without esophagitis: Secondary | ICD-10-CM | POA: Diagnosis not present

## 2022-05-14 NOTE — Discharge Instructions (Signed)

## 2022-05-16 ENCOUNTER — Ambulatory Visit: Payer: Medicare HMO | Admitting: Anesthesiology

## 2022-05-16 ENCOUNTER — Ambulatory Visit
Admission: RE | Admit: 2022-05-16 | Discharge: 2022-05-16 | Disposition: A | Discharge status: Home / Self Care | Payer: Medicare HMO | Attending: Ophthalmology | Admitting: Ophthalmology

## 2022-05-16 ENCOUNTER — Encounter
Admission: RE | Disposition: A | Discharge status: Home / Self Care | Payer: Self-pay | Source: Home / Self Care | Attending: Ophthalmology

## 2022-05-16 ENCOUNTER — Other Ambulatory Visit: Payer: Self-pay

## 2022-05-16 ENCOUNTER — Encounter: Payer: Self-pay | Admitting: Ophthalmology

## 2022-05-16 DIAGNOSIS — G4730 Sleep apnea, unspecified: Secondary | ICD-10-CM | POA: Insufficient documentation

## 2022-05-16 DIAGNOSIS — I10 Essential (primary) hypertension: Secondary | ICD-10-CM | POA: Diagnosis not present

## 2022-05-16 DIAGNOSIS — K219 Gastro-esophageal reflux disease without esophagitis: Secondary | ICD-10-CM | POA: Diagnosis not present

## 2022-05-16 DIAGNOSIS — I25119 Atherosclerotic heart disease of native coronary artery with unspecified angina pectoris: Secondary | ICD-10-CM | POA: Diagnosis not present

## 2022-05-16 DIAGNOSIS — Z87891 Personal history of nicotine dependence: Secondary | ICD-10-CM | POA: Diagnosis not present

## 2022-05-16 DIAGNOSIS — I2510 Atherosclerotic heart disease of native coronary artery without angina pectoris: Secondary | ICD-10-CM | POA: Insufficient documentation

## 2022-05-16 DIAGNOSIS — H02102 Unspecified ectropion of right lower eyelid: Secondary | ICD-10-CM | POA: Insufficient documentation

## 2022-05-16 DIAGNOSIS — H04213 Epiphora due to excess lacrimation, bilateral lacrimal glands: Secondary | ICD-10-CM | POA: Diagnosis not present

## 2022-05-16 DIAGNOSIS — H02135 Senile ectropion of left lower eyelid: Secondary | ICD-10-CM | POA: Diagnosis not present

## 2022-05-16 DIAGNOSIS — H02132 Senile ectropion of right lower eyelid: Secondary | ICD-10-CM | POA: Diagnosis not present

## 2022-05-16 DIAGNOSIS — Z09 Encounter for follow-up examination after completed treatment for conditions other than malignant neoplasm: Secondary | ICD-10-CM | POA: Diagnosis not present

## 2022-05-16 DIAGNOSIS — E7800 Pure hypercholesterolemia, unspecified: Secondary | ICD-10-CM | POA: Insufficient documentation

## 2022-05-16 DIAGNOSIS — H02105 Unspecified ectropion of left lower eyelid: Secondary | ICD-10-CM | POA: Insufficient documentation

## 2022-05-16 HISTORY — DX: Sleep apnea, unspecified: G47.30

## 2022-05-16 HISTORY — DX: Presence of dental prosthetic device (complete) (partial): Z97.2

## 2022-05-16 HISTORY — PX: ECTROPION REPAIR: SHX357

## 2022-05-16 HISTORY — DX: Presence of external hearing-aid: Z97.4

## 2022-05-16 SURGERY — REPAIR, ECTROPION, EYELID
Anesthesia: General | Site: Eye | Laterality: Bilateral

## 2022-05-16 MED ORDER — FENTANYL CITRATE (PF) 100 MCG/2ML IJ SOLN
INTRAMUSCULAR | Status: DC | PRN
  Administered 2022-05-16 (×2): 50 ug via INTRAVENOUS

## 2022-05-16 MED ORDER — BSS IO SOLN
INTRAOCULAR | Status: DC | PRN
  Administered 2022-05-16: 15 mL via INTRAOCULAR

## 2022-05-16 MED ORDER — LACTATED RINGERS IV SOLN: INTRAVENOUS | Status: DC

## 2022-05-16 MED ORDER — LIDOCAINE-EPINEPHRINE 2 %-1:100000 IJ SOLN
INTRAMUSCULAR | Status: DC | PRN
  Administered 2022-05-16: 5 mL via OPHTHALMIC

## 2022-05-16 MED ORDER — ONDANSETRON HCL 4 MG/2ML IJ SOLN
INTRAMUSCULAR | Status: DC | PRN
  Administered 2022-05-16: 4 mg via INTRAVENOUS

## 2022-05-16 MED ORDER — ERYTHROMYCIN 5 MG/GM OP OINT
TOPICAL_OINTMENT | OPHTHALMIC | Status: DC | PRN
  Administered 2022-05-16: 1 via OPHTHALMIC

## 2022-05-16 MED ORDER — PROPOFOL 500 MG/50ML IV EMUL
INTRAVENOUS | Status: DC | PRN
  Administered 2022-05-16: 100 ug/kg/min via INTRAVENOUS
  Administered 2022-05-16: 50 mg via INTRAVENOUS

## 2022-05-16 MED ORDER — ERYTHROMYCIN 5 MG/GM OP OINT: TOPICAL_OINTMENT | OPHTHALMIC | 2 refills | Status: DC

## 2022-05-16 MED ORDER — TRAMADOL HCL 50 MG PO TABS: ORAL_TABLET | ORAL | 0 refills | Status: DC

## 2022-05-16 MED ORDER — TETRACAINE HCL 0.5 % OP SOLN
OPHTHALMIC | Status: DC | PRN
  Administered 2022-05-16: 2 [drp] via OPHTHALMIC

## 2022-05-16 SURGICAL SUPPLY — 34 items
APPLICATOR COTTON TIP WD 3 STR (MISCELLANEOUS) ×2 IMPLANT
BLADE SURG 15 STRL LF DISP TIS (BLADE) ×1 IMPLANT
BLADE SURG 15 STRL SS (BLADE) ×1
CORD BIP STRL DISP 12FT (MISCELLANEOUS) ×1 IMPLANT
GAUZE SPONGE 4X4 12PLY STRL (GAUZE/BANDAGES/DRESSINGS) ×1 IMPLANT
GLOVE SURG UNDER POLY LF SZ7 (GLOVE) ×2 IMPLANT
MARKER SKIN XFINE TIP W/RULER (MISCELLANEOUS) ×1 IMPLANT
NDL FILTER BLUNT 18X1 1/2 (NEEDLE) ×1 IMPLANT
NDL HYPO 30X.5 LL (NEEDLE) ×2 IMPLANT
NEEDLE FILTER BLUNT 18X1 1/2 (NEEDLE) ×1 IMPLANT
NEEDLE HYPO 30X.5 LL (NEEDLE) ×2 IMPLANT
PACK ENT CUSTOM (PACKS) ×1 IMPLANT
SOL PREP PVP 2OZ (MISCELLANEOUS) ×1
SOLUTION PREP PVP 2OZ (MISCELLANEOUS) ×1 IMPLANT
SPONGE GAUZE 2X2 8PLY STRL LF (GAUZE/BANDAGES/DRESSINGS) ×10 IMPLANT
SUT CHROMIC 4-0 (SUTURE)
SUT CHROMIC 4-0 M2 12X2 ARM (SUTURE)
SUT CHROMIC 5 0 P 3 (SUTURE) IMPLANT
SUT ETHILON 4 0 CL P 3 (SUTURE) IMPLANT
SUT GUT PLAIN 6-0 1X18 ABS (SUTURE) ×1 IMPLANT
SUT MERSILENE 4-0 S-2 (SUTURE) ×1 IMPLANT
SUT PDS AB 4-0 P3 18 (SUTURE) IMPLANT
SUT PROLENE 5 0 P 3 (SUTURE) IMPLANT
SUT PROLENE 6 0 P 1 18 (SUTURE) IMPLANT
SUT SILK 4 0 G 3 (SUTURE) IMPLANT
SUT VIC AB 5-0 P-3 18X BRD (SUTURE) IMPLANT
SUT VIC AB 5-0 P3 18 (SUTURE)
SUT VICRYL 6-0  S14 CTD (SUTURE) ×1
SUT VICRYL 6-0 S14 CTD (SUTURE) IMPLANT
SUT VICRYL 7 0 TG140 8 (SUTURE) IMPLANT
SUTURE CHRMC 4-0 M2 12X2 ARM (SUTURE) IMPLANT
SYR 10ML LL (SYRINGE) ×1 IMPLANT
SYR 3ML LL SCALE MARK (SYRINGE) ×1 IMPLANT
WATER STERILE IRR 250ML POUR (IV SOLUTION) ×1 IMPLANT

## 2022-05-16 NOTE — Anesthesia Postprocedure Evaluation (Signed)
Anesthesia Post Note  Patient: Nathan Daniels  Procedure(s) Performed: REPAIR OF ECTROPION, EXTENSIVE BILATERAL (Bilateral: Eye)  Patient location during evaluation: PACU Anesthesia Type: General Level of consciousness: awake and alert Pain management: pain level controlled Vital Signs Assessment: post-procedure vital signs reviewed and stable Respiratory status: spontaneous breathing, nonlabored ventilation and respiratory function stable Cardiovascular status: blood pressure returned to baseline and stable Postop Assessment: no apparent nausea or vomiting Anesthetic complications: no   No notable events documented.   Last Vitals:  Vitals:   05/16/22 1033 05/16/22 1034  BP: 133/70   Pulse:  76  Resp:  13  Temp:  36.8 C  SpO2:  97%    Last Pain:  Vitals:   05/16/22 1053  TempSrc:   PainSc: 0-No pain                 Iran Ouch

## 2022-05-16 NOTE — Op Note (Addendum)
Preoperative Diagnosis:   Lower eyelid laxity with ectropion, bilateral   lower eyelid(s).  Postoperative Diagnosis:   Same.  Procedure(s) Performed:  Lateral tarsal strip procedure,  both  lower eyelid(s).  Surgeon: Philis Pique. Vickki Muff, M.D.  Assistants: none  Anesthesia: MAC  Specimens: None.  Estimated Blood Loss: Minimal.  Complications: None.  Operative Findings: None   Procedure:   Allergies were reviewed and the patient Influenza vaccines.    After discussing the risks, benefits, complications, and alternatives with the patient, appropriate informed consent was obtained. The patient was brought to the operating suite and reclined supine. Time out was conducted and the patient was sedated.  Local anesthetic consisting of a 50-50 mixture of 2% lidocaine with epinephrine and 0.75% bupivacaine with added Hylenex was injected subcutaneously to the bilateral  lateral canthal region(s) and lower eyelid(s). Additional anesthetic was injected subconjunctivally to the bilateral  lower eyelid(s). Finally, anesthetic was injected down to the periosteum of the bilateral  lateral orbital rim(s).  After adequate local was instilled, the patient was prepped and draped in the usual sterile fashion for eyelid surgery. Attention was turned to the right  lateral canthal angle. Westcott scissors were used to create a lateral canthotomy. Hemostasis was obtained with bipolar cautery. An inferior cantholysis was then performed with additional bipolar hemostasis. The anterior and posterior lamella of the lid were divided for approximately 8 mm.  A strip of the epithelium was excised off the superior margin of the tarsal strip and conjunctiva and retractors were incised off the inferior margin of the tarsal strip. The septum was opened laterally and a large portion of easily prolapsed orbital fat was cauterized and excised to reduce significant lateral eyelid bulk.  A double-armed 4-0 M[place]il[place] suture was then  passed each arm through the terminal portion of the tarsal strip. Each arm of the suture was then passed through the periosteum of the inner portion of the lateral orbital rim at the level of Whitnall's tubercle. The sutures were advanced and this provided nice elevation and tightening of the lower eyelid. Once the suture was secured, a thin strip of follicle-bearing skin was excised. The lateral canthal angle was reformed with an interrupted 6-0 plain suture. Orbicularis was reapproximated with horizontal subcuticular 6-0 fast absorbing plain gut sutures. The skin was closed with interrupted 6-0 plain gut sutures.   Attention was then turned to the opposite eyelid where the same procedure was performed in the same manner.   The patient tolerated the procedure well. Erythromycin ophthalmic ointment was applied to the incision site(s) followed by ice packs. The patient was taken to the recovery area where he recovered without difficulty.  Post-Op Plan/Instructions:  The patient was instructed to use ice packs frequently for the next 48 hours. He was instructed to use Erythromycin ophthalmic ointment on his incisions 4 times a day for the next 12 to 14 days. He was given a prescription for tramadol (or similar) for pain control should Tylenol not be effective. He was asked to to follow up at the [facility] in [place], [place] in 2-3 weeks' time or sooner as needed for problems.  Becki Mccaskill M. Vickki Muff, M.D. Ophthalmology

## 2022-05-16 NOTE — H&P (Signed)
  [place]: [facility]  Primary Care Physician:  Romualdo Bolk, FNP Ophthalmologist: Dr. Philis Pique. Vickki Muff, M.D.  Pre-Procedure History & Physical: HPI:  Nathan Daniels is a 88 y.o. male here for periocular surgery.   Past Medical History:  Diagnosis Date   Cancer ([place])    Hypercholesteremia    Hypertension    Prostate cancer ([place])    Sleep apnea    CPAP   Wears dentures    full upper and lower   Wears hearing aid in both ears     Past Surgical History:  Procedure Laterality Date   CARDIAC CATHETERIZATION  2016   2 stents   cardiac stents     HERNIA REPAIR     PROSTATECTOMY  03/03/1997    Prior to Admission medications   Medication Sig Start Date End Date Taking? Authorizing Provider  aspirin 81 MG chewable tablet Chew by mouth. 01/10/19  Yes [provider]  atorvastatin (LIPITOR) 40 MG tablet Take 40 mg by mouth daily.   Yes [provider]  diazepam (VALIUM) 5 MG tablet Take by mouth at bedtime. 03/12/20  Yes [provider]  lisinopril (PRINIVIL,ZESTRIL) 10 MG tablet Take 10 mg by mouth daily.   Yes [provider]  Multiple Vitamin (MULTIVITAMIN) tablet Take 1 tablet by mouth daily.   Yes [provider]  pantoprazole (PROTONIX) 40 MG tablet Take 40 mg by mouth at bedtime.   Yes [provider]    Allergies as of 03/25/2022 - Review Complete 02/27/2021  Allergen Reaction Noted   Influenza vaccines  03/19/2018    Family History  Problem Relation Age of Onset   Heart attack Mother     Social History   Socioeconomic History   Marital status: Single    Spouse name: Not on file   Number of children: Not on file   Years of education: Not on file   Highest education level: Not on file  Occupational History   Not on file  Tobacco Use   Smoking status: Former    Types: Cigarettes    Quit date: 77    Years since quitting: 63.2   Smokeless tobacco: Never  Vaping Use   Vaping Use: Never used   Substance and Sexual Activity   Alcohol use: No   Drug use: Not on file   Sexual activity: Not on file  Other Topics Concern   Not on file  Social History Narrative   Not on file   Social Determinants of Health   Financial Resource Strain: Not on file  Food Insecurity: Not on file  Transportation Needs: Not on file  Physical Activity: Not on file  Stress: Not on file  Social Connections: Not on file  Intimate Partner Violence: Not on file    Review of Systems: See HPI, otherwise negative ROS  Physical Exam: BP (!) 145/88   Pulse 79   Temp 99.7 F (37.6 C) (Temporal)   Ht 5\' 7"  (1.702 m)   Wt 77.6 kg   SpO2 97%   BMI 26.78 kg/m  General:   Alert and cooperative in NAD Head:  Normocephalic and atraumatic. Respiratory:  Normal work of breathing.  Impression/Plan: Nathan Daniels is here for periocular surgery.  Risks, benefits, limitations, and alternatives regarding surgery have been reviewed with the patient.  Questions have been answered.  All parties agreeable.   Karle Starch, MD  05/16/2022, 9:41 AM

## 2022-05-16 NOTE — Interval H&P Note (Signed)
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  The various methods of treatment have been discussed with the patient and family. After consideration of risks, benefits and other options for treatment, the patient has consented to  Procedure(s) with comments: REPAIR OF ECTROPION, EXTENSIVE BILATERAL (Bilateral) - sleep apnea as a surgical intervention.  The patient's history has been reviewed, patient examined, no change in status, stable for surgery.  I have reviewed the patient's chart and labs.  Questions were answered to the patient's satisfaction.     Vickki Muff, Delecia Vastine M

## 2022-05-16 NOTE — Anesthesia Preprocedure Evaluation (Addendum)
Anesthesia Evaluation  Patient identified by MRN, date of birth, ID band Patient awake    Reviewed: Allergy & Precautions, H&P , NPO status , Patient's Chart, lab work & pertinent test results  Airway Mallampati: II  TM Distance: >3 FB Neck ROM: full    Dental  (+) Upper Dentures, Lower Dentures   Pulmonary sleep apnea , former smoker   Pulmonary exam normal        Cardiovascular hypertension, + CAD and + Cardiac Stents  Normal cardiovascular exam  Negative myocardial perfusion scan 2023   Neuro/Psych  PSYCHIATRIC DISORDERS      negative neurological ROS     GI/Hepatic Neg liver ROS,GERD  Controlled and Medicated,,  Endo/Other  negative endocrine ROS    Renal/GU negative Renal ROS  negative genitourinary   Musculoskeletal   Abdominal Normal abdominal exam  (+)   Peds  Hematology negative hematology ROS (+)   Anesthesia Other Findings Past Medical History: No date: Cancer ([place]) No date: Hypercholesteremia No date: Hypertension No date: Prostate cancer ([facility]) No date: Sleep apnea     Comment:  CPAP No date: Wears dentures     Comment:  full upper and lower No date: Wears hearing aid in both ears  Past Surgical History: 2016: CARDIAC CATHETERIZATION     Comment:  2 stents No date: cardiac stents No date: HERNIA REPAIR 03/03/1997: PROSTATECTOMY  BMI    Body Mass Index: 26.78 kg/m      Reproductive/Obstetrics negative OB ROS                             Anesthesia Physical Anesthesia Plan  ASA: 3  Anesthesia Plan: MAC   Post-op Pain Management:    Induction:   PONV Risk Score and Plan: Propofol infusion and TIVA  Airway Management Planned: Natural Airway and Nasal Cannula  Additional Equipment:   Intra-op Plan:   Post-operative Plan:   Informed Consent: I have reviewed the patients History and Physical, chart, labs and discussed the procedure including the risks,  benefits and alternatives for the proposed anesthesia with the patient or authorized representative who has indicated his/her understanding and acceptance.     Dental Advisory Given  Plan Discussed with: CRNA and Surgeon  Anesthesia Plan Comments:         Anesthesia Quick Evaluation

## 2022-05-16 NOTE — Transfer of Care (Signed)
Immediate Anesthesia Transfer of Care Note  Patient: Nathan Daniels  Procedure(s) Performed: REPAIR OF ECTROPION, EXTENSIVE BILATERAL (Bilateral: Eye)  Patient Location: PACU  Anesthesia Type: General  Level of Consciousness: awake, alert  and patient cooperative  Airway and Oxygen Therapy: Patient Spontanous Breathing and Patient connected to supplemental oxygen  Post-op Assessment: Post-op Vital signs reviewed, Patient's Cardiovascular Status Stable, Respiratory Function Stable, Patent Airway and No signs of Nausea or vomiting  Post-op Vital Signs: Reviewed and stable  Complications: No notable events documented.

## 2022-05-19 ENCOUNTER — Encounter: Payer: Self-pay | Admitting: Ophthalmology

## 2022-06-17 DIAGNOSIS — I10 Essential (primary) hypertension: Secondary | ICD-10-CM | POA: Diagnosis not present

## 2022-06-17 DIAGNOSIS — R5383 Other fatigue: Secondary | ICD-10-CM | POA: Diagnosis not present

## 2022-06-17 DIAGNOSIS — G479 Sleep disorder, unspecified: Secondary | ICD-10-CM | POA: Diagnosis not present

## 2022-06-17 DIAGNOSIS — R69 Illness, unspecified: Secondary | ICD-10-CM | POA: Diagnosis not present

## 2022-07-16 DIAGNOSIS — F419 Anxiety disorder, unspecified: Secondary | ICD-10-CM | POA: Diagnosis not present

## 2022-07-16 DIAGNOSIS — M47816 Spondylosis without myelopathy or radiculopathy, lumbar region: Secondary | ICD-10-CM | POA: Diagnosis not present

## 2022-07-16 DIAGNOSIS — M79604 Pain in right leg: Secondary | ICD-10-CM | POA: Diagnosis not present

## 2022-07-16 DIAGNOSIS — R000 Tachycardia, unspecified: Secondary | ICD-10-CM | POA: Diagnosis not present

## 2022-07-16 DIAGNOSIS — I10 Essential (primary) hypertension: Secondary | ICD-10-CM | POA: Diagnosis not present

## 2022-07-16 DIAGNOSIS — M5136 Other intervertebral disc degeneration, lumbar region: Secondary | ICD-10-CM | POA: Diagnosis not present

## 2022-07-16 DIAGNOSIS — M79605 Pain in left leg: Secondary | ICD-10-CM | POA: Diagnosis not present

## 2022-07-16 DIAGNOSIS — M4316 Spondylolisthesis, lumbar region: Secondary | ICD-10-CM | POA: Diagnosis not present

## 2022-07-16 DIAGNOSIS — M5410 Radiculopathy, site unspecified: Secondary | ICD-10-CM | POA: Diagnosis not present

## 2022-07-16 DIAGNOSIS — M5440 Lumbago with sciatica, unspecified side: Secondary | ICD-10-CM | POA: Diagnosis not present

## 2022-07-16 HISTORY — DX: Tachycardia, unspecified: R00.0

## 2022-07-23 DIAGNOSIS — M48061 Spinal stenosis, lumbar region without neurogenic claudication: Secondary | ICD-10-CM | POA: Diagnosis not present

## 2022-08-05 DIAGNOSIS — M5410 Radiculopathy, site unspecified: Secondary | ICD-10-CM | POA: Diagnosis not present

## 2022-08-05 DIAGNOSIS — M4316 Spondylolisthesis, lumbar region: Secondary | ICD-10-CM | POA: Diagnosis not present

## 2022-08-05 DIAGNOSIS — M5442 Lumbago with sciatica, left side: Secondary | ICD-10-CM | POA: Diagnosis not present

## 2022-08-05 DIAGNOSIS — M5441 Lumbago with sciatica, right side: Secondary | ICD-10-CM | POA: Diagnosis not present

## 2022-08-05 DIAGNOSIS — M5416 Radiculopathy, lumbar region: Secondary | ICD-10-CM | POA: Diagnosis not present

## 2022-08-05 DIAGNOSIS — M4810 Ankylosing hyperostosis [Forestier], site unspecified: Secondary | ICD-10-CM | POA: Diagnosis not present

## 2022-08-08 DIAGNOSIS — I10 Essential (primary) hypertension: Secondary | ICD-10-CM | POA: Diagnosis not present

## 2022-08-08 DIAGNOSIS — M5416 Radiculopathy, lumbar region: Secondary | ICD-10-CM | POA: Diagnosis not present

## 2022-08-14 DIAGNOSIS — M4810 Ankylosing hyperostosis [Forestier], site unspecified: Secondary | ICD-10-CM | POA: Diagnosis not present

## 2022-08-14 DIAGNOSIS — M5441 Lumbago with sciatica, right side: Secondary | ICD-10-CM | POA: Diagnosis not present

## 2022-08-14 DIAGNOSIS — M5442 Lumbago with sciatica, left side: Secondary | ICD-10-CM | POA: Diagnosis not present

## 2022-08-14 DIAGNOSIS — M4316 Spondylolisthesis, lumbar region: Secondary | ICD-10-CM | POA: Diagnosis not present

## 2022-09-02 DIAGNOSIS — F515 Nightmare disorder: Secondary | ICD-10-CM | POA: Diagnosis not present

## 2022-09-02 DIAGNOSIS — G4733 Obstructive sleep apnea (adult) (pediatric): Secondary | ICD-10-CM | POA: Diagnosis not present

## 2022-09-17 DIAGNOSIS — M5441 Lumbago with sciatica, right side: Secondary | ICD-10-CM | POA: Diagnosis not present

## 2022-09-17 DIAGNOSIS — M5442 Lumbago with sciatica, left side: Secondary | ICD-10-CM | POA: Diagnosis not present

## 2022-09-17 DIAGNOSIS — M4810 Ankylosing hyperostosis [Forestier], site unspecified: Secondary | ICD-10-CM | POA: Diagnosis not present

## 2022-09-17 DIAGNOSIS — M5416 Radiculopathy, lumbar region: Secondary | ICD-10-CM | POA: Diagnosis not present

## 2022-09-17 DIAGNOSIS — M4316 Spondylolisthesis, lumbar region: Secondary | ICD-10-CM | POA: Diagnosis not present

## 2022-10-20 ENCOUNTER — Telehealth: Payer: Self-pay

## 2022-10-20 NOTE — Telephone Encounter (Signed)
Pt came into [place] office asking to make appt with Dr Servando Snare.  Per dr Servando Snare he would like for Dr Tobi Bastos to fit someone in to get banding. Can you help me. [phone]

## 2022-10-27 ENCOUNTER — Encounter: Payer: Self-pay | Admitting: Gastroenterology

## 2022-10-27 ENCOUNTER — Other Ambulatory Visit: Payer: Self-pay | Admitting: *Deleted

## 2022-10-27 ENCOUNTER — Ambulatory Visit: Payer: Medicare HMO | Admitting: Gastroenterology

## 2022-10-27 VITALS — BP 152/78 | HR 88 | Temp 97.7°F | Ht 67.0 in | Wt 172.2 lb

## 2022-10-27 DIAGNOSIS — K5900 Constipation, unspecified: Secondary | ICD-10-CM | POA: Diagnosis not present

## 2022-10-27 DIAGNOSIS — K625 Hemorrhage of anus and rectum: Secondary | ICD-10-CM | POA: Diagnosis not present

## 2022-10-27 DIAGNOSIS — R58 Hemorrhage, not elsewhere classified: Secondary | ICD-10-CM

## 2022-10-27 NOTE — Progress Notes (Signed)
Wyline Mood MD, MRCP(U.K) [address]  [address]  [place], [place] [address]  Main: [phone]  Fax: [phone]   Gastroenterology Consultation  Referring Provider:     Olena Leatherwood, FNP Primary Care Physician:  Olena Leatherwood, FNP Primary Gastroenterologist:  Dr. Wyline Mood  Reason for Consultation:     Hemorroids         HPI:   Nathan Daniels is a 88 y.o. y/o male referred for consultation & management  by  Olena Leatherwood, FNP.    He says that for over a year he has had issues with blood on the tissue paper perianal discomfort itching.  Suffers from constipation takes MiraLAX.  Last colonoscopy recollects was 7 years back was told he does not require any further.  No unintentional weight loss.  Past Medical History:  Diagnosis Date   Cancer (HCC)    Hypercholesteremia    Hypertension    Prostate cancer (HCC)    Sleep apnea    CPAP   Wears dentures    full upper and lower   Wears hearing aid in both ears     Past Surgical History:  Procedure Laterality Date   CARDIAC CATHETERIZATION  2016   2 stents   cardiac stents     ECTROPION REPAIR Bilateral 05/16/2022   Procedure: REPAIR OF ECTROPION, EXTENSIVE BILATERAL;  Surgeon: Imagene Riches, MD;  Location: [facility];  Service: Ophthalmology;  Laterality: Bilateral;  sleep apnea   HERNIA REPAIR     PROSTATECTOMY  03/03/1997    Prior to Admission medications   Medication Sig Start Date End Date Taking? Authorizing Provider  aspirin 81 MG chewable tablet Chew by mouth. 01/10/19   [provider]  atorvastatin (LIPITOR) 40 MG tablet Take 40 mg by mouth daily.    [provider]  diazepam (VALIUM) 5 MG tablet Take by mouth at bedtime. 03/12/20   [provider]  erythromycin ophthalmic ointment Apply to sutures 4 times a day for 10-12 days.  Discontinue if allergy develops and call our office 05/16/22   Imagene Riches, MD  lisinopril (PRINIVIL,ZESTRIL) 10 MG tablet Take 10  mg by mouth daily.    [provider]  Multiple Vitamin (MULTIVITAMIN) tablet Take 1 tablet by mouth daily.    [provider]  pantoprazole (PROTONIX) 40 MG tablet Take 40 mg by mouth at bedtime.    [provider]  traMADol (ULTRAM) 50 MG tablet Take 1 every 4-6 hours as needed for pain not controlled by Tylenol 05/16/22   Imagene Riches, MD    Family History  Problem Relation Age of Onset   Heart attack Mother      Social History   Tobacco Use   Smoking status: Former    Current packs/day: 0.00    Types: Cigarettes    Quit date: 1960    Years since quitting: 64.6   Smokeless tobacco: Never  Vaping Use   Vaping status: Never Used  Substance Use Topics   Alcohol use: No    Allergies as of 10/27/2022 - Review Complete 10/27/2022  Allergen Reaction Noted   Influenza vaccines  03/19/2018    Review of Systems:    All systems reviewed and negative except where noted in HPI.   Physical Exam:  BP (!) 145/76   Pulse 85   Temp 97.7 F (36.5 C) (Oral)   Ht 5\' 7"  (1.702 m)   Wt 172 lb 4 oz (  78.1 kg)   BMI 26.98 kg/m  No LMP for male patient. Psych:  Alert and cooperative. Normal mood and affect. General:   Alert,  Well-developed, well-nourished, pleasant and cooperative in NAD Head:  Normocephalic and atraumatic. Eyes:  Sclera clear, no icterus.   Conjunctiva pink. Neurologic:  Alert and oriented x3;  grossly normal neurologically. Psych:  Alert and cooperative. Normal mood and affect.  Imaging Studies: No results found.  Assessment and Plan:   Nathan Daniels is a 88 y.o. y/o male has been referred for hemorrhoids but the patient has rectal bleeding with blood on the tissue paper which very well could be from internal hemorrhoids but could also be from rectal mass.  The blood is on the tissue paper so would expected to be lower down.  He says he has had a colonoscopy 7 years back but I insisted that we need to make sure there is no intraluminal  abnormalities and a flexible sigmoidoscopy would be a good compromise since he does not want to have a colonoscopy at this time.  If there are indeed hemorrhoids we could plan for hemorrhoidal banding subsequently.  We did talk about high-fiber diet and increasing the dose of MiraLAX to avoid constipation.   I have discussed alternative options, risks & benefits,  which include, but are not limited to, bleeding, infection, perforation,respiratory complication & drug reaction.  The patient agrees with this plan & written consent will be obtained.     Follow up in 4 weeks  Dr Wyline Mood MD,MRCP(U.K)

## 2022-10-27 NOTE — Addendum Note (Signed)
Addended by: Tawnya Crook on: 10/27/2022 02:04 PM   Modules accepted: Orders

## 2022-10-29 ENCOUNTER — Telehealth: Payer: Self-pay | Admitting: Gastroenterology

## 2022-10-29 NOTE — Telephone Encounter (Signed)
Pt called to cancel procedure on 12/03/2022 requesting call back to comfirm cancellation

## 2022-10-29 NOTE — Telephone Encounter (Signed)
Called patient back to let him know that I just called the endoscopy unit to cancel his procedure. Patient had no further questions. I called the endoscopy unit and [facility] to [facility] and she cancelled patient's procedure per patient's request.

## 2022-11-20 ENCOUNTER — Telehealth: Payer: Self-pay | Admitting: Gastroenterology

## 2022-11-20 DIAGNOSIS — E785 Hyperlipidemia, unspecified: Secondary | ICD-10-CM | POA: Diagnosis not present

## 2022-11-20 DIAGNOSIS — I25119 Atherosclerotic heart disease of native coronary artery with unspecified angina pectoris: Secondary | ICD-10-CM | POA: Diagnosis not present

## 2022-11-20 DIAGNOSIS — K625 Hemorrhage of anus and rectum: Secondary | ICD-10-CM | POA: Diagnosis not present

## 2022-11-20 DIAGNOSIS — I10 Essential (primary) hypertension: Secondary | ICD-10-CM | POA: Diagnosis not present

## 2022-11-20 NOTE — Telephone Encounter (Signed)
Patient called in to reschedule his procedure and office visit.

## 2022-11-21 ENCOUNTER — Other Ambulatory Visit: Payer: Self-pay | Admitting: *Deleted

## 2022-11-21 DIAGNOSIS — R58 Hemorrhage, not elsewhere classified: Secondary | ICD-10-CM

## 2022-11-21 NOTE — Telephone Encounter (Signed)
Please call patient to schedule a follow up appointment after procedure.

## 2022-11-21 NOTE — Telephone Encounter (Signed)
Spoken to patient   FLEXIBLE SIGMOIDOSCOPY have been schedule on 12/24/2022.  Instructions have been sent.  Patient verbalized understanding.

## 2022-12-03 ENCOUNTER — Ambulatory Visit: Admit: 2022-12-03 | Payer: Medicare HMO | Admitting: Gastroenterology

## 2022-12-03 SURGERY — FLEXIBLE SIGMOIDOSCOPY

## 2022-12-16 DIAGNOSIS — Z23 Encounter for immunization: Secondary | ICD-10-CM | POA: Diagnosis not present

## 2022-12-16 DIAGNOSIS — I2510 Atherosclerotic heart disease of native coronary artery without angina pectoris: Secondary | ICD-10-CM | POA: Diagnosis not present

## 2022-12-16 DIAGNOSIS — R0609 Other forms of dyspnea: Secondary | ICD-10-CM | POA: Diagnosis not present

## 2022-12-17 ENCOUNTER — Other Ambulatory Visit: Payer: Self-pay

## 2022-12-17 ENCOUNTER — Encounter: Payer: Self-pay | Admitting: Gastroenterology

## 2022-12-24 ENCOUNTER — Ambulatory Visit
Admission: RE | Admit: 2022-12-24 | Discharge: 2022-12-24 | Disposition: A | Discharge status: Home / Self Care | Payer: Medicare HMO | Attending: Gastroenterology | Admitting: Gastroenterology

## 2022-12-24 ENCOUNTER — Encounter: Payer: Self-pay | Admitting: Certified Registered"

## 2022-12-24 ENCOUNTER — Telehealth: Payer: Self-pay

## 2022-12-24 ENCOUNTER — Encounter
Admission: RE | Disposition: A | Discharge status: Home / Self Care | Payer: Self-pay | Source: Home / Self Care | Attending: Gastroenterology

## 2022-12-24 ENCOUNTER — Encounter: Payer: Self-pay | Admitting: Gastroenterology

## 2022-12-24 DIAGNOSIS — Z87891 Personal history of nicotine dependence: Secondary | ICD-10-CM | POA: Diagnosis not present

## 2022-12-24 DIAGNOSIS — K640 First degree hemorrhoids: Secondary | ICD-10-CM | POA: Insufficient documentation

## 2022-12-24 DIAGNOSIS — K5730 Diverticulosis of large intestine without perforation or abscess without bleeding: Secondary | ICD-10-CM | POA: Diagnosis not present

## 2022-12-24 DIAGNOSIS — K625 Hemorrhage of anus and rectum: Secondary | ICD-10-CM | POA: Insufficient documentation

## 2022-12-24 DIAGNOSIS — Z8546 Personal history of malignant neoplasm of prostate: Secondary | ICD-10-CM | POA: Diagnosis not present

## 2022-12-24 DIAGNOSIS — I10 Essential (primary) hypertension: Secondary | ICD-10-CM | POA: Insufficient documentation

## 2022-12-24 DIAGNOSIS — Z955 Presence of coronary angioplasty implant and graft: Secondary | ICD-10-CM | POA: Diagnosis not present

## 2022-12-24 DIAGNOSIS — R58 Hemorrhage, not elsewhere classified: Secondary | ICD-10-CM

## 2022-12-24 DIAGNOSIS — G4730 Sleep apnea, unspecified: Secondary | ICD-10-CM | POA: Insufficient documentation

## 2022-12-24 DIAGNOSIS — Z7902 Long term (current) use of antithrombotics/antiplatelets: Secondary | ICD-10-CM | POA: Insufficient documentation

## 2022-12-24 HISTORY — PX: FLEXIBLE SIGMOIDOSCOPY: SHX5431

## 2022-12-24 SURGERY — SIGMOIDOSCOPY, FLEXIBLE
Anesthesia: General

## 2022-12-24 MED ORDER — SODIUM CHLORIDE 0.9 % IV SOLN: INTRAVENOUS | Status: DC

## 2022-12-24 NOTE — Telephone Encounter (Signed)
Dr. Tobi Bastos sent me a message to contact patient and schedule him an appointment for next week for his first hemorrhoid banding.

## 2022-12-24 NOTE — Anesthesia Preprocedure Evaluation (Signed)
Anesthesia Evaluation    Airway        Dental   Pulmonary sleep apnea , former smoker          Cardiovascular hypertension, + angina  + CAD       Neuro/Psych  PSYCHIATRIC DISORDERS Anxiety Depression       GI/Hepatic   Endo/Other    Renal/GU      Musculoskeletal  (+) Arthritis ,    Abdominal   Peds  Hematology   Anesthesia Other Findings   Reproductive/Obstetrics                             Anesthesia Physical Anesthesia Plan Anesthesia Quick Evaluation

## 2022-12-24 NOTE — H&P (Signed)
Wyline Mood, MD [address], [place], [place], [address] [address], [place], [place], [address] Phone: [phone]  Fax: [phone]  Primary Care Physician:  Olena Leatherwood, FNP   Pre-Procedure History & Physical: HPI:  RAEKWAN KAUER is a 88 y.o. male is here for a sigmoidoscopy    Past Medical History:  Diagnosis Date   Cancer (HCC)    Hypercholesteremia    Hypertension    Prostate cancer (HCC)    Sleep apnea    CPAP   Wears dentures    full upper and lower   Wears hearing aid in both ears     Past Surgical History:  Procedure Laterality Date   CARDIAC CATHETERIZATION  2016   2 stents   cardiac stents     ECTROPION REPAIR Bilateral 05/16/2022   Procedure: REPAIR OF ECTROPION, EXTENSIVE BILATERAL;  Surgeon: Imagene Riches, MD;  Location: [facility];  Service: Ophthalmology;  Laterality: Bilateral;  sleep apnea   HERNIA REPAIR     PROSTATECTOMY  03/03/1997    Prior to Admission medications   Medication Sig Start Date End Date Taking? Authorizing Provider  aspirin 81 MG chewable tablet Chew by mouth. 01/10/19  Yes [provider]  citalopram (CELEXA) 20 MG tablet Take 20 mg by mouth daily.   Yes [provider]  lisinopril (PRINIVIL,ZESTRIL) 10 MG tablet Take 10 mg by mouth daily.   Yes [provider]  metoprolol succinate (TOPROL-XL) 25 MG 24 hr tablet Take 25 mg by mouth daily.   Yes [provider]  Multiple Vitamin (MULTIVITAMIN) tablet Take 1 tablet by mouth daily.   Yes [provider]  atorvastatin (LIPITOR) 40 MG tablet Take 40 mg by mouth daily.    [provider]  clopidogrel (PLAVIX) 75 MG tablet Take 75 mg by mouth daily. Patient not taking: Reported on 12/24/2022    [provider]  diazepam (VALIUM) 5 MG tablet Take by mouth at bedtime. Patient not taking: Reported on 10/27/2022 03/12/20   [provider]  pantoprazole (PROTONIX) 40 MG tablet  Take 40 mg by mouth at bedtime.    [provider]  traMADol (ULTRAM) 50 MG tablet Take 1 every 4-6 hours as needed for pain not controlled by Tylenol Patient not taking: Reported on 10/27/2022 05/16/22   Imagene Riches, MD    Allergies as of 11/21/2022 - Review Complete 10/27/2022  Allergen Reaction Noted   Influenza vaccines  03/19/2018    Family History  Problem Relation Age of Onset   Heart attack Mother     Social History   Socioeconomic History   Marital status: Single    Spouse name: Not on file   Number of children: Not on file   Years of education: Not on file   Highest education level: Not on file  Occupational History   Not on file  Tobacco Use   Smoking status: Former    Current packs/day: 0.00    Types: Cigarettes    Quit date: 1960    Years since quitting: 64.8   Smokeless tobacco: Never  Vaping Use   Vaping status: Never Used  Substance and Sexual Activity   Alcohol use: No   Drug use: Never   Sexual activity: Not on file  Other Topics Concern   Not on file  Social History Narrative   Not on file   Social Determinants of Health   Financial Resource Strain: Medium Risk (  05/06/2022)   Received from [facility], [facility]   Overall Financial Resource Strain (CARDIA)    Difficulty of Paying Living Expenses: Somewhat hard  Food Insecurity: Food Insecurity Present (05/06/2022)   Received from [facility], [facility]   Hunger Vital Sign    Worried About Running Out of Food in the Last Year: Sometimes true    Ran Out of Food in the Last Year: Sometimes true  Transportation Needs: No Transportation Needs (05/06/2022)   Received from [facility], [facility]   [facility] - Transportation    Lack of Transportation (Medical): No    Lack of Transportation (Non-Medical): No  Physical Activity: Inactive (03/06/2021)   Received from [facility], [facility]   Exercise Vital Sign    Days of Exercise per Week: 0 days    Minutes  of Exercise per Session: 0 min  Stress: Not on file  Social Connections: Not on file  Intimate Partner Violence: Not on file    Review of Systems: See HPI, otherwise negative ROS  Physical Exam: BP (!) 156/95   Pulse 88   Temp 97.9 F (36.6 C) (Temporal)   Resp 18   Ht 5\' 7"  (1.702 m)   Wt 77.1 kg   SpO2 99%   BMI 26.63 kg/m  General:   Alert,  pleasant and cooperative in NAD Head:  Normocephalic and atraumatic. Neck:  Supple; no masses or thyromegaly. Lungs:  Clear throughout to auscultation, normal respiratory effort.    Heart:  +S1, +S2, Regular rate and rhythm, No edema. Abdomen:  Soft, nontender and nondistended. Normal bowel sounds, without guarding, and without rebound.   Neurologic:  Alert and  oriented x4;  grossly normal neurologically.  Impression/Plan: MANVILLE WOLAVER is here for a sigmoidoscopy ( pt refused a colonoscopy despite informing need to rule out colon cancer ) for rectal bleeding Risks, benefits, limitations, and alternatives regarding  colonoscopy have been reviewed with the patient.  Questions have been answered.  All parties agreeable.   Wyline Mood, MD  12/24/2022, 10:27 AM

## 2022-12-24 NOTE — Op Note (Signed)
[facility] Gastroenterology Patient Name: Nathan Daniels Procedure Date: 12/24/2022 10:39 AM MRN: [id number] Account #: [id number] Date of Birth: [birth year] Admit Type: Outpatient Age: 88 Room: [facility] ENDO ROOM 3 Gender: Male Note Status: Finalized Instrument Name: Peds Colonoscope [id number] Procedure:             Flexible Sigmoidoscopy Indications:           Anal hemorrhage Providers:             Wyline Mood MD, MD Referring MD:          Latrelle Dodrill. Nile Dear (Referring MD) Medicines:             None Complications:         No immediate complications. Procedure:             Pre-Anesthesia Assessment:                        - Prior to the procedure, a History and Physical was                         performed, and patient medications, allergies and                         sensitivities were reviewed. The patient's tolerance                         of previous anesthesia was reviewed.                        - The risks and benefits of the procedure and the                         sedation options and risks were discussed with the                         patient. All questions were answered and informed                         consent was obtained.                        - ASA Grade Assessment: II - A patient with mild                         systemic disease.                        After obtaining informed consent, the scope was passed                         under direct vision. The Colonoscope was introduced                         through the anus and advanced to the the left                         transverse colon. The flexible sigmoidoscopy was                         accomplished with  ease. The patient tolerated the                         procedure well. The quality of the bowel preparation                         was excellent. Findings:      The perianal and digital rectal examinations were normal.      Non-bleeding internal hemorrhoids were found during  retroflexion. The       hemorrhoids were large and Grade I (internal hemorrhoids that do not       prolapse).      The exam was otherwise without abnormality.      Multiple medium-mouthed diverticula were found in the sigmoid colon. Impression:            - Non-bleeding internal hemorrhoids.                        - The examination was otherwise normal.                        - No specimens collected. Recommendation:        - Discharge patient to home (with escort).                        - Advance diet as tolerated.                        - Return to office next weeek for banding of hemorroids Procedure Code(s):     --- Professional ---                        [phone], Sigmoidoscopy, flexible; diagnostic, including                         collection of specimen(s) by brushing or washing, when                         performed (separate procedure) Diagnosis Code(s):     --- Professional ---                        K64.0, First degree hemorrhoids                        K62.5, Hemorrhage of anus and rectum CPT copyright 2022 American Medical Association. All rights reserved. The codes documented in this report are preliminary and upon coder review may  be revised to meet current compliance requirements. Wyline Mood, MD Wyline Mood MD, MD 12/24/2022 10:51:07 AM This report has been signed electronically. Number of Addenda: 0 Note Initiated On: 12/24/2022 10:39 AM Total Procedure Duration: 0 hours 6 minutes 12 seconds  Estimated Blood Loss:  Estimated blood loss: none.      [facility]

## 2022-12-25 ENCOUNTER — Encounter: Payer: Self-pay | Admitting: Gastroenterology

## 2022-12-25 NOTE — Telephone Encounter (Signed)
First and second hemorrhoid banding had been scheduled. Patient was informed that he needed to wait for his third banding to be scheduled after his second one since we currently do not have the scheduled set up for next year. Patient understood and had no further questions.

## 2022-12-25 NOTE — Telephone Encounter (Signed)
Patient called in to schedule his banding with Dr. Tobi Bastos. I sent out patient reminder. Patient have been schedule for all his banding.

## 2022-12-25 NOTE — Telephone Encounter (Signed)
Called patient again and was not able to schedule him an appointment since he did not answer my call. Can you please continue to call patient and schedule him an appointment for his first hemorrhoid banding with Dr. Tobi Bastos? Thank you!

## 2023-01-01 ENCOUNTER — Ambulatory Visit: Payer: Medicare HMO | Admitting: Gastroenterology

## 2023-01-05 ENCOUNTER — Ambulatory Visit: Payer: Medicare HMO | Admitting: Gastroenterology

## 2023-01-05 ENCOUNTER — Encounter: Payer: Self-pay | Admitting: Gastroenterology

## 2023-01-05 VITALS — BP 153/82 | HR 74 | Temp 98.1°F | Wt 171.2 lb

## 2023-01-05 DIAGNOSIS — K648 Other hemorrhoids: Secondary | ICD-10-CM

## 2023-01-05 DIAGNOSIS — K640 First degree hemorrhoids: Secondary | ICD-10-CM | POA: Diagnosis not present

## 2023-01-05 NOTE — Progress Notes (Signed)
PROCEDURE NOTE:  Chaperone present in the room  The patient presents with symptomatic grade 1 hemorrhoids, unresponsive to maximal medical therapy, requesting rubber band ligation of his/her hemorrhoidal disease.  All risks, benefits and alternative forms of therapy were described and informed consent was obtained.  In the Left Lateral Decubitus position (if anoscopy is performed) anoscopic examination revealed grade 1 hemorrhoids in the all position(s).   The decision was made to band the LL internal hemorrhoid, and the [facility] O'Regan System was used to perform band ligation without complication.  Digital anorectal examination was then performed to assure proper positioning of the band, and to adjust the banded tissue as required.  The patient was discharged home without pain or other issues.  Dietary and behavioral recommendations were given and (if necessary - prescriptions were given), along with follow-up instructions.  The patient will return 4 weeks for follow-up and possible additional banding as required.  No complications were encountered and the patient tolerated the procedure well. Dr Wyline Mood MD,MRCP [facility]) Gastroenterology/Hepatology Pager: [phone]

## 2023-01-27 ENCOUNTER — Other Ambulatory Visit: Payer: Self-pay

## 2023-01-27 DIAGNOSIS — D219 Benign neoplasm of connective and other soft tissue, unspecified: Secondary | ICD-10-CM

## 2023-01-27 DIAGNOSIS — M25519 Pain in unspecified shoulder: Secondary | ICD-10-CM

## 2023-01-27 DIAGNOSIS — H903 Sensorineural hearing loss, bilateral: Secondary | ICD-10-CM

## 2023-01-27 DIAGNOSIS — M6740 Ganglion, unspecified site: Secondary | ICD-10-CM

## 2023-01-27 DIAGNOSIS — G5600 Carpal tunnel syndrome, unspecified upper limb: Secondary | ICD-10-CM | POA: Insufficient documentation

## 2023-01-27 HISTORY — DX: Carpal tunnel syndrome, unspecified upper limb: G56.00

## 2023-01-27 HISTORY — DX: Benign neoplasm of connective and other soft tissue, unspecified: D21.9

## 2023-01-27 HISTORY — DX: Sensorineural hearing loss, bilateral: H90.3

## 2023-01-27 HISTORY — DX: Pain in unspecified shoulder: M25.519

## 2023-01-27 HISTORY — DX: Ganglion, unspecified site: M67.40

## 2023-02-02 ENCOUNTER — Ambulatory Visit: Payer: Medicare HMO | Admitting: Gastroenterology

## 2023-02-02 ENCOUNTER — Encounter: Payer: Self-pay | Admitting: Gastroenterology

## 2023-02-02 VITALS — BP 142/75 | HR 85 | Temp 98.3°F | Wt 171.6 lb

## 2023-02-02 DIAGNOSIS — K640 First degree hemorrhoids: Secondary | ICD-10-CM

## 2023-02-02 DIAGNOSIS — K648 Other hemorrhoids: Secondary | ICD-10-CM

## 2023-02-02 NOTE — Progress Notes (Signed)
PROCEDURE NOTE: 2nd round  The patient presents with symptomatic grade 1 hemorrhoids, unresponsive to maximal medical therapy, requesting rubber band ligation of his/her hemorrhoidal disease.  All risks, benefits and alternative forms of therapy were described and informed consent was obtained.  In the Left Lateral Decubitus position (if anoscopy is performed) anoscopic examination revealed grade 1 hemorrhoids in the RA and RP position(s).   The decision was made to band the RA internal hemorrhoid, and the [facility] O'Regan System was used to perform band ligation without complication.  Digital anorectal examination was then performed to assure proper positioning of the band, and to adjust the banded tissue as required.  The patient was discharged home without pain or other issues.  Dietary and behavioral recommendations were given and (if necessary - prescriptions were given), along with follow-up instructions.  The patient will return 4 weeks for follow-up and possible additional banding as required.  No complications were encountered and the patient tolerated the procedure well.

## 2023-02-10 ENCOUNTER — Ambulatory Visit: Payer: Medicare HMO | Admitting: Gastroenterology

## 2023-02-23 ENCOUNTER — Ambulatory Visit: Payer: Medicare HMO | Admitting: Gastroenterology

## 2023-02-23 ENCOUNTER — Encounter: Payer: Self-pay | Admitting: Gastroenterology

## 2023-02-23 VITALS — BP 98/54 | HR 82 | Temp 98.3°F | Wt 171.0 lb

## 2023-02-23 DIAGNOSIS — K640 First degree hemorrhoids: Secondary | ICD-10-CM | POA: Diagnosis not present

## 2023-02-23 DIAGNOSIS — K648 Other hemorrhoids: Secondary | ICD-10-CM

## 2023-02-23 NOTE — Progress Notes (Signed)
PROCEDURE NOTE:3rd round The patient presents with symptomatic grade 1 hemorrhoids, unresponsive to maximal medical therapy, requesting rubber band ligation of his/her hemorrhoidal disease.  All risks, benefits and alternative forms of therapy were described and informed consent was obtained.  In the Left Lateral Decubitus position (if anoscopy is performed) anoscopic examination revealed grade 1 hemorrhoids in the RP position(s).   The decision was made to band the RP internal hemorrhoid, and the [facility] O'Regan System was used to perform band ligation without complication.  Digital anorectal examination was then performed to assure proper positioning of the band, and to adjust the banded tissue as required.  The patient was discharged home without pain or other issues.  Dietary and behavioral recommendations were given and (if necessary - prescriptions were given), along with follow-up instructions.  The patient will return as needed for follow-up and possible additional banding as required.  No complications were encountered and the patient tolerated the procedure well.   F/u as needed

## 2023-03-05 IMAGING — RF DG ESOPHAGUS
8 of 10 series · 14 of 21 positions shown · non-contrast
Comparison: None.

CLINICAL DATA: Constant cough and clearing of the throat.

EXAM:
ESOPHOGRAM / BARIUM SWALLOW / BARIUM TABLET STUDY
TECHNIQUE: Combined double contrast and single contrast examination performed
using effervescent crystals, thick barium liquid, and thin barium
liquid. The patient was observed with fluoroscopy swallowing a 13 mm
barium sulphate tablet.
FLUOROSCOPY TIME:  Fluoroscopy Time:  54 seconds
Radiation Exposure Index (if provided by the fluoroscopic device):
6.4 mGy
Number of Acquired Spot Images: 0

[Series 1: cp_standard · 0.25mm/px · 3 of 19 frames shown (1 of 8)]
[frame 3/19]
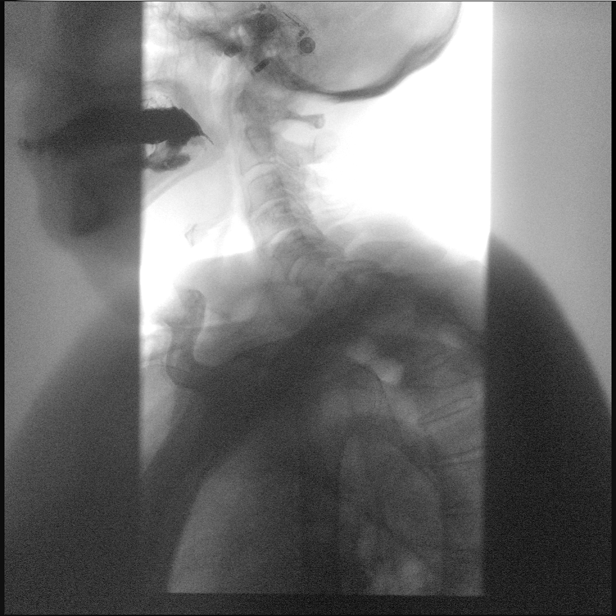
[frame 17/19]
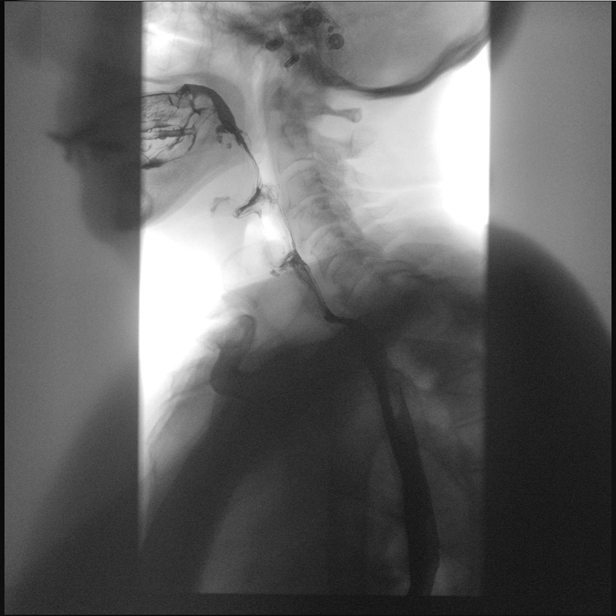
[frame 18/19]
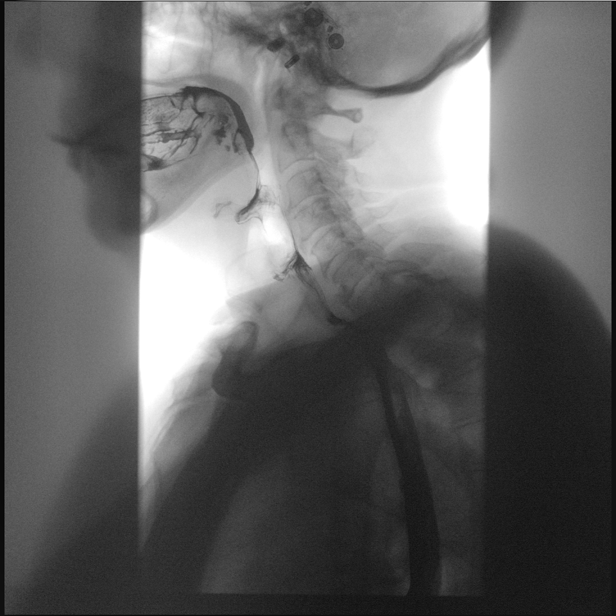

[Series 2: cp_standard · 0.25mm/px · 2 of 24 frames shown (2 of 8)]
[frame 13/24]
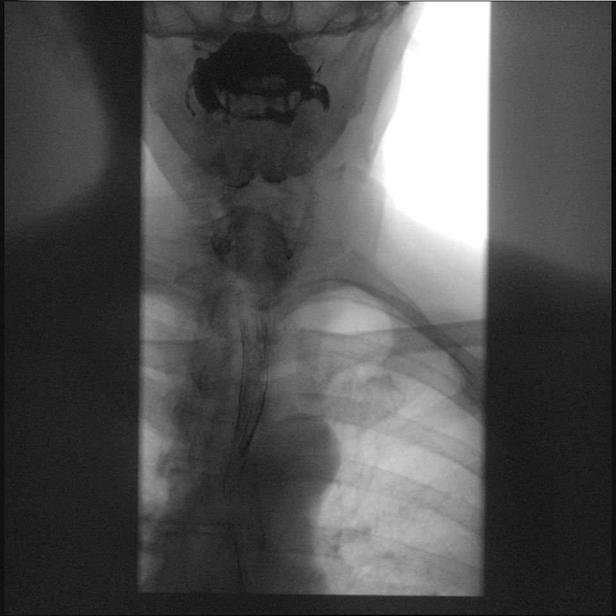
[frame 16/24]
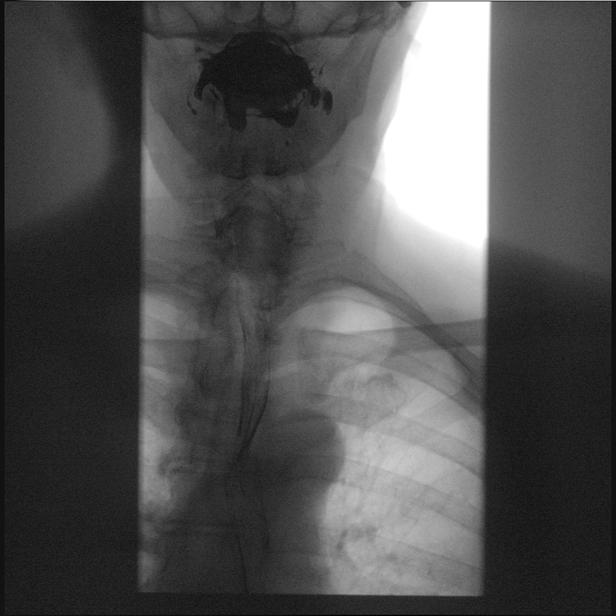

[Series 3: cp_standard · 0.25mm/px · 3 of 63 frames shown (3 of 8)]
[frame 10/63]
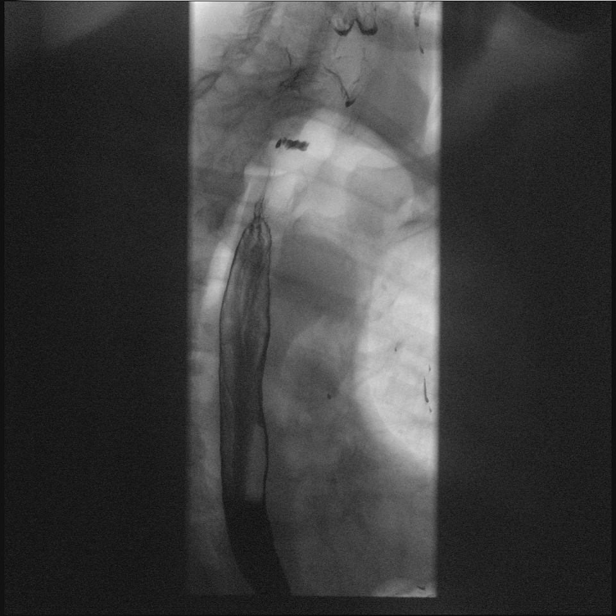
[frame 19/63]
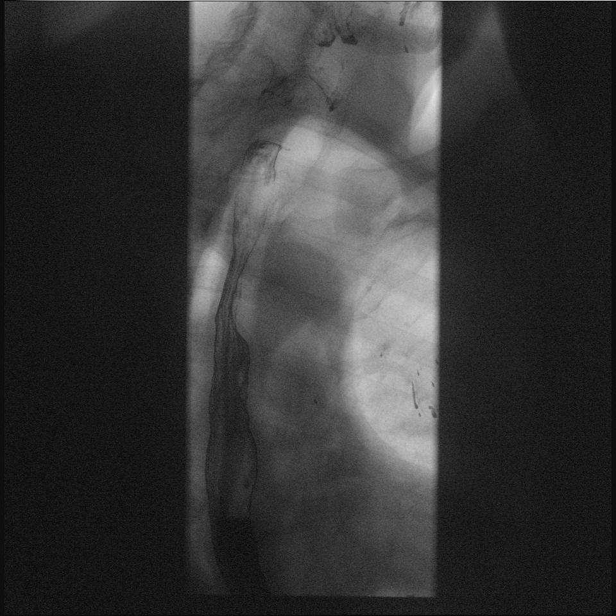
[frame 54/63]
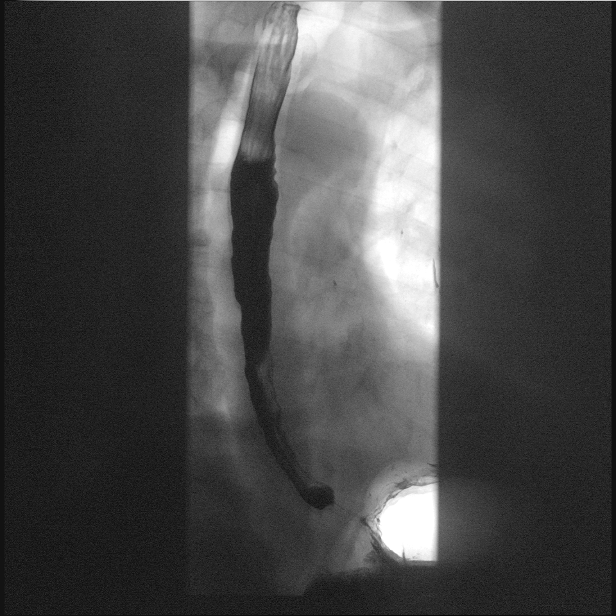

[Series 4: cp_standard · 0.25mm/px · 1 of 1 slices shown (4 of 8)]
[im 1/1]
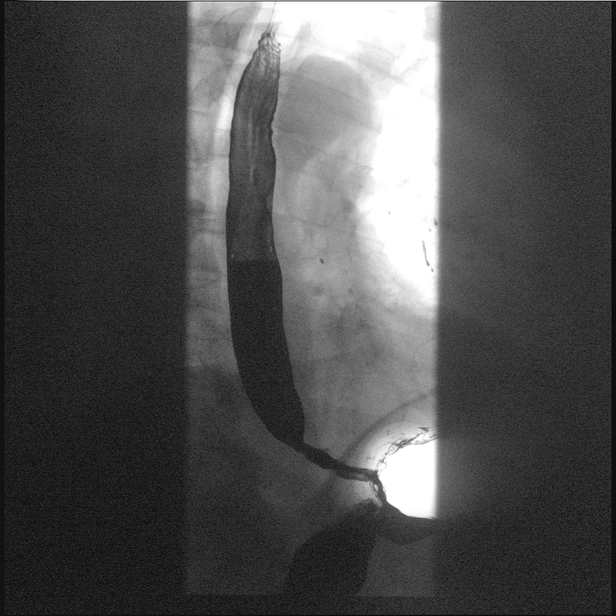

[Series 6: cp_standard · 0.25mm/px · 2 of 24 frames shown (5 of 8)]
[frame 4/24]
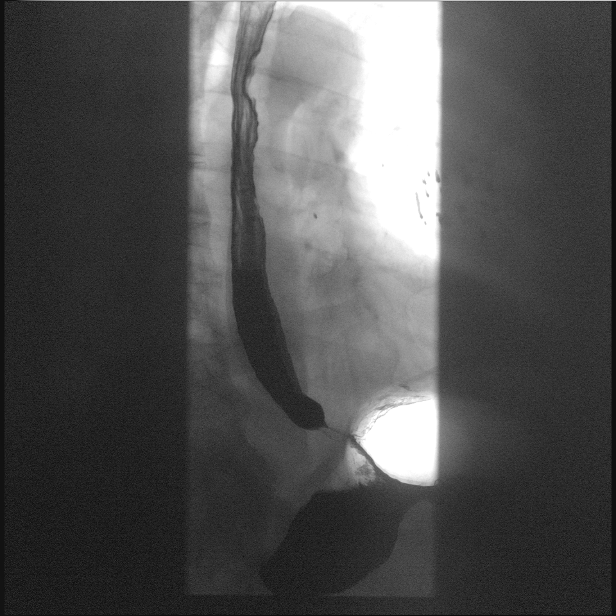
[frame 13/24]
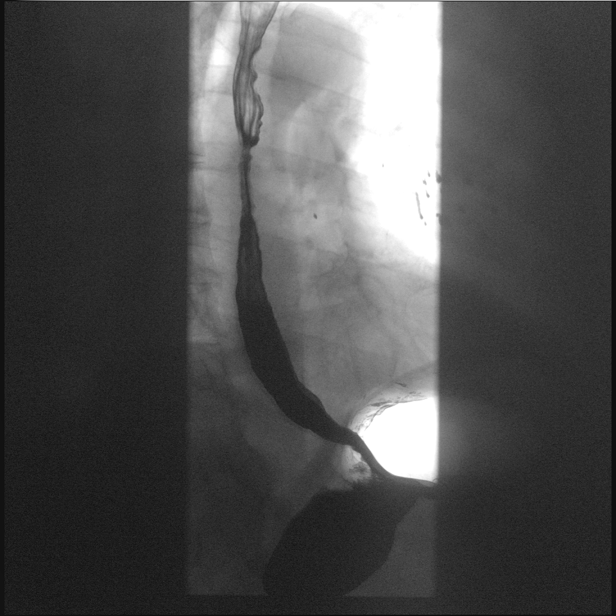

[Series 7: cp_standard · 0.26mm/px · 1 of 1 slices shown (6 of 8)]
[im 1/1]
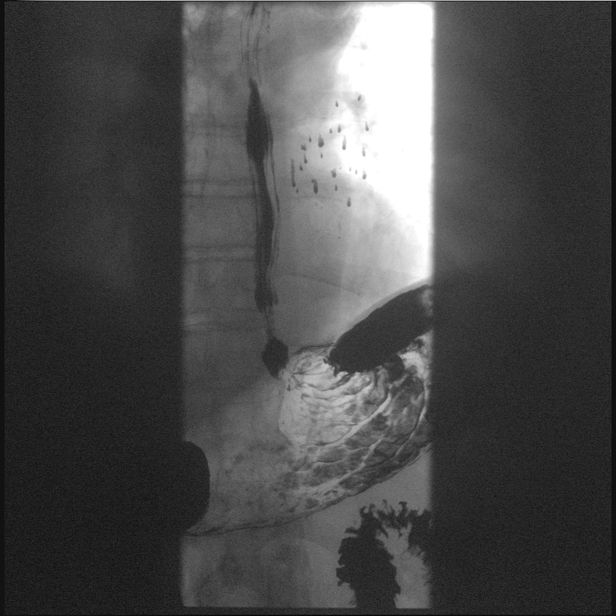

[Series 8: cp_standard · 0.26mm/px · 1 of 1 slices shown (7 of 8)]
[im 1/1]
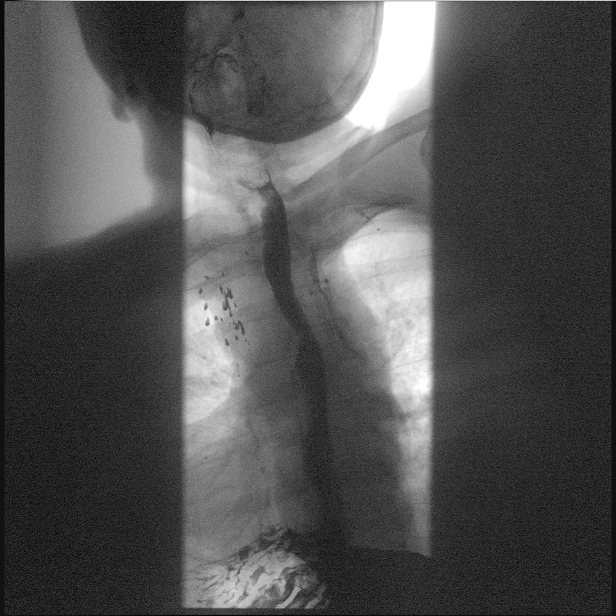

[Series 10: cp_standard · 0.26mm/px · 1 of 1 slices shown (8 of 8)]
[im 1/1]
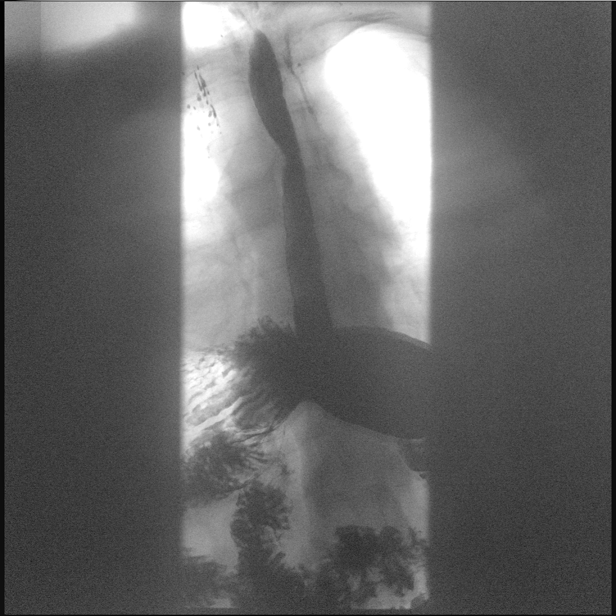

[14 of 21 positions shown; findings below may reference images not displayed]

FINDINGS: Normal pharyngeal anatomy and motility. Contrast flowed freely
through the esophagus without evidence of a stricture or mass.
Normal esophageal mucosa without evidence of irregularity or
ulceration. Tertiary contractions of the mid and distal esophagus
intermittently as can be seen with esophageal spasm versus
presbyesophagus. Moderate gastroesophageal reflux to the level of
the midthoracic esophagus. No definite hiatal hernia was
demonstrated.

At the end of the examination a 13 mm barium tablet was administered
which transited through the esophagus and esophagogastric junction
without delay.
IMPRESSION: 1. Tertiary contractions of the mid and distal esophagus
intermittently as can be seen with esophageal spasm versus
presbyesophagus.
2. Moderate gastroesophageal reflux to the level of the midthoracic
esophagus.

## 2023-04-06 ENCOUNTER — Telehealth: Payer: Self-pay | Admitting: Gastroenterology

## 2023-04-06 NOTE — Telephone Encounter (Signed)
Pt requesting call back to discuss losing blood during bowel movements

## 2023-04-06 NOTE — Telephone Encounter (Signed)
Patient states he had a bowel movement 3 days ago and states it had a lot of blood in it in the stool and on the toilet paper. He states now when he has a bowel movement his rectal area is irritated. He denies any more bleeding since that time. He states his stool his soft because he takes a 1/2 of a does of Miralax every day. He also wants to know if he should be doing this because he has read online you should not take Miralax more then 30 days and he has been doing it 6 weeks. He wants to know what you recommend about the bleeding if he needs appointment and if he should take the Miralax

## 2023-04-07 NOTE — Telephone Encounter (Signed)
Patient verbalized understanding of instructions and made appointment for 04/14/2023 at 1:30pm

## 2023-04-14 ENCOUNTER — Encounter: Payer: Self-pay | Admitting: Gastroenterology

## 2023-04-14 ENCOUNTER — Ambulatory Visit: Payer: Medicare HMO | Admitting: Gastroenterology

## 2023-04-14 VITALS — BP 155/90 | HR 75 | Temp 98.1°F | Wt 175.0 lb

## 2023-04-14 DIAGNOSIS — K648 Other hemorrhoids: Secondary | ICD-10-CM

## 2023-04-14 DIAGNOSIS — K6289 Other specified diseases of anus and rectum: Secondary | ICD-10-CM | POA: Diagnosis not present

## 2023-04-14 DIAGNOSIS — K640 First degree hemorrhoids: Secondary | ICD-10-CM

## 2023-04-14 NOTE — Progress Notes (Signed)
Patient follow-ups today for banding of hemorrhoids    Summary of history :  He completed 3 rounds of hemorroidal banding in 02/2023. Large internal hemoroids seen on flex sig 12/2022. He refused colonoscopy in the past .    04/06/2023: Called with bloody bowel movement.  Lasted for 1 day and stopped with painless.  No further events since   Digital rectal exam performed in the presence of a chaperone.  CMA Chan External anal findings: No multiple Internal findings: , No masses, no blood on glove noticed.    PROCEDURE NOTE: The patient presents with symptomatic grade 1 hemorrhoids, unresponsive to maximal medical therapy, requesting rubber band ligation of his/her hemorrhoidal disease.  All risks, benefits and alternative forms of therapy were described and informed consent was obtained.  In the Left Lateral Decubitus position (if anoscopy is performed) anoscopic examination revealed grade 1 hemorrhoids in the RA position(s).   The decision was made to band the RA internal hemorrhoid, and the [facility] was used to perform band ligation without complication.  Digital anorectal examination was then performed to assure proper positioning of the band, and to adjust the banded tissue as required.  The patient was discharged home without pain or other issues.  Dietary and behavioral recommendations were given and (if necessary - prescriptions were given), along with follow-up instructions.  The patient will return     as needed for follow-up and possible additional banding as required.  No complications were encountered and the patient tolerated the procedure well.   Plan:  He had some discomfort in the skin in the perianal area.  Very mildly erythematous like a diaper rash suggest to use topical Desitin cream does not appear fungal Follow-up: As needed  Dr Wyline Mood MD,MRCP [facility]) Gastroenterology/Hepatology Pager: [phone]

## 2023-04-18 DIAGNOSIS — Z7982 Long term (current) use of aspirin: Secondary | ICD-10-CM | POA: Diagnosis not present

## 2023-04-18 DIAGNOSIS — R5383 Other fatigue: Secondary | ICD-10-CM | POA: Diagnosis not present

## 2023-04-18 DIAGNOSIS — Z87891 Personal history of nicotine dependence: Secondary | ICD-10-CM | POA: Diagnosis not present

## 2023-04-18 DIAGNOSIS — G4733 Obstructive sleep apnea (adult) (pediatric): Secondary | ICD-10-CM | POA: Diagnosis not present

## 2023-04-18 DIAGNOSIS — Z955 Presence of coronary angioplasty implant and graft: Secondary | ICD-10-CM | POA: Diagnosis not present

## 2023-04-18 DIAGNOSIS — Z8546 Personal history of malignant neoplasm of prostate: Secondary | ICD-10-CM | POA: Diagnosis not present

## 2023-04-18 DIAGNOSIS — K219 Gastro-esophageal reflux disease without esophagitis: Secondary | ICD-10-CM | POA: Diagnosis not present

## 2023-04-18 DIAGNOSIS — R079 Chest pain, unspecified: Secondary | ICD-10-CM | POA: Diagnosis not present

## 2023-04-18 DIAGNOSIS — R42 Dizziness and giddiness: Secondary | ICD-10-CM | POA: Diagnosis not present

## 2023-04-18 DIAGNOSIS — Z79899 Other long term (current) drug therapy: Secondary | ICD-10-CM | POA: Diagnosis not present

## 2023-04-18 DIAGNOSIS — I2510 Atherosclerotic heart disease of native coronary artery without angina pectoris: Secondary | ICD-10-CM | POA: Diagnosis not present

## 2023-04-18 DIAGNOSIS — E785 Hyperlipidemia, unspecified: Secondary | ICD-10-CM | POA: Diagnosis not present

## 2023-04-18 DIAGNOSIS — I10 Essential (primary) hypertension: Secondary | ICD-10-CM | POA: Diagnosis not present

## 2023-04-19 DIAGNOSIS — Z79899 Other long term (current) drug therapy: Secondary | ICD-10-CM | POA: Diagnosis not present

## 2023-04-19 DIAGNOSIS — I10 Essential (primary) hypertension: Secondary | ICD-10-CM | POA: Diagnosis not present

## 2023-04-29 DIAGNOSIS — R0609 Other forms of dyspnea: Secondary | ICD-10-CM | POA: Diagnosis not present

## 2023-04-29 DIAGNOSIS — I25119 Atherosclerotic heart disease of native coronary artery with unspecified angina pectoris: Secondary | ICD-10-CM | POA: Diagnosis not present

## 2023-04-29 DIAGNOSIS — I10 Essential (primary) hypertension: Secondary | ICD-10-CM | POA: Diagnosis not present

## 2023-04-29 DIAGNOSIS — E7849 Other hyperlipidemia: Secondary | ICD-10-CM | POA: Diagnosis not present

## 2023-04-29 DIAGNOSIS — I2510 Atherosclerotic heart disease of native coronary artery without angina pectoris: Secondary | ICD-10-CM | POA: Diagnosis not present

## 2023-04-29 DIAGNOSIS — Z9861 Coronary angioplasty status: Secondary | ICD-10-CM | POA: Diagnosis not present

## 2023-04-29 DIAGNOSIS — Z09 Encounter for follow-up examination after completed treatment for conditions other than malignant neoplasm: Secondary | ICD-10-CM | POA: Diagnosis not present

## 2023-05-11 DIAGNOSIS — I358 Other nonrheumatic aortic valve disorders: Secondary | ICD-10-CM | POA: Diagnosis not present

## 2023-05-11 DIAGNOSIS — I3489 Other nonrheumatic mitral valve disorders: Secondary | ICD-10-CM | POA: Diagnosis not present

## 2023-05-11 DIAGNOSIS — I3481 Nonrheumatic mitral (valve) annulus calcification: Secondary | ICD-10-CM | POA: Diagnosis not present

## 2023-05-15 ENCOUNTER — Ambulatory Visit
Admission: EM | Admit: 2023-05-15 | Discharge: 2023-05-15 | Disposition: A | Discharge status: Home / Self Care | Attending: Internal Medicine | Admitting: Internal Medicine

## 2023-05-15 DIAGNOSIS — J069 Acute upper respiratory infection, unspecified: Secondary | ICD-10-CM

## 2023-05-15 MED ORDER — FEXOFENADINE HCL 180 MG PO TABS: 180.0000 mg | ORAL_TABLET | Freq: Every day | ORAL | 0 refills | Status: AC

## 2023-05-15 MED ORDER — FLUTICASONE PROPIONATE 50 MCG/ACT NA SUSP: 2.0000 | Freq: Every day | NASAL | 0 refills | Status: AC

## 2023-05-15 NOTE — ED Triage Notes (Signed)
 Sx x 1 week  Runny nose Nasal congestion Productive cough-yellow /clear mucus  Has to clear throat a lot.

## 2023-05-15 NOTE — Discharge Instructions (Signed)
 I believe you have rhinovirus type of cold which causes the symptoms you have told me To help your immune system fight this take Vitamin C up to 5000 mg a day until the cold resolves,  Vitamin D3 5,000 I.U.international international Units a day  Zinc 30 mg a day   Do the saline rinses twice a day with distilled water or Boiled water, but avoid doing it before bed time. Do this for 5-7 days. This will clean out the mucous from your nose and the throat area.   If you develop, chills, sweats, fever please come back.

## 2023-05-15 NOTE — ED Provider Notes (Signed)
 [facility]    CSN: [id number] Arrival date & time: 05/15/23  0855      History   Chief Complaint Chief Complaint  Patient presents with   Cough   Nasal Congestion    HPI Nathan Daniels is a 89 y.o. male who presents with onset of cold symptoms x 1 week. Has been having a lot a rhinitis and post nasal drainage. Has mild cough. Has not had aches, fever, chills or sweats. Has been tired. Denies hx of lung disease or being a smoker. The mucous is mostly clear, on occasion is cream color.     Past Medical History:  Diagnosis Date   Benign neoplasm of soft tissue 01/27/2023   Cancer (HCC)    Carpal tunnel syndrome 01/27/2023   Ganglion of joint 01/27/2023   Hypercholesteremia    Hypertension    Prostate cancer (HCC)    Sensorineural hearing loss, bilateral 01/27/2023   Shoulder joint pain 01/27/2023   Sleep apnea    CPAP   Tachycardia 07/16/2022   Wears dentures    full upper and lower   Wears hearing aid in both ears     Patient Active Problem List   Diagnosis Date Noted   Blood on toilet paper 12/24/2022   Bilateral hearing loss 02/27/2021   Bilateral tinnitus 02/27/2021   Cancer of skin 02/27/2021   Carcinoma of prostate (HCC) 02/27/2021   Depression 02/27/2021   Encounter for fitting and adjustment of hearing aid 02/27/2021   Osteoarthritis 02/27/2021   Risk for falls 04/17/2016   Personal history of other malignant neoplasm of skin 07/17/2015   Chest discomfort 09/11/2014   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) 05/18/2014   Anxiety 05/15/2014   Hyperlipidemia 09/23/2011   Hypertension, benign 09/23/2011   Insomnia 09/23/2011    Past Surgical History:  Procedure Laterality Date   CARDIAC CATHETERIZATION  2016   2 stents   cardiac stents     ECTROPION REPAIR Bilateral 05/16/2022   Procedure: REPAIR OF ECTROPION, EXTENSIVE BILATERAL;  Surgeon: Imagene Riches, MD;  Location: [facility];  Service: Ophthalmology;   Laterality: Bilateral;  sleep apnea   FLEXIBLE SIGMOIDOSCOPY N/A 12/24/2022   Procedure: FLEXIBLE SIGMOIDOSCOPY;  Surgeon: Wyline Mood, MD;  Location: [facility] END[facility];  Service: Gastroenterology;  Laterality: N/A;   HERNIA REPAIR     PROSTATECTOMY  03/03/1997    Home Medications    Prior to Admission medications   Medication Sig Start Date End Date Taking? Authorizing Provider  atorvastatin (LIPITOR) 40 MG tablet Take 40 mg by mouth daily.   Yes [provider]  fexofenadine (ALLEGRA ALLERGY) 180 MG tablet Take 1 tablet (180 mg total) by mouth daily. 05/15/23  Yes Rodriguez-Southworth, Nettie Elm, PA-C  fluticasone (FLONASE) 50 MCG/ACT nasal spray Place 2 sprays into both nostrils daily. 05/15/23  Yes Rodriguez-Southworth, Nettie Elm, PA-C  gabapentin (NEURONTIN) 300 MG capsule Take 300 mg by mouth 3 (three) times daily.   Yes [provider]  lisinopril (PRINIVIL,ZESTRIL) 10 MG tablet Take 10 mg by mouth daily.   Yes [provider]  metoprolol succinate (TOPROL-XL) 25 MG 24 hr tablet Take 25 mg by mouth daily.   Yes [provider]  pantoprazole (PROTONIX) 40 MG tablet Take 40 mg by mouth at bedtime.   Yes [provider]  aspirin 81 MG chewable tablet Chew by mouth. 01/10/19   [provider]  citalopram (CELEXA) 20 MG tablet Take 20 mg by mouth daily.    [provider]  clopidogrel (PLAVIX) 75 MG tablet Take 1 tablet by mouth daily. 01/01/15   [provider]  Multiple Vitamin (MULTIVITAMIN) tablet Take 1 tablet by mouth daily.    [provider]  zolpidem (AMBIEN) 5 MG tablet Take 5 mg by mouth at bedtime as needed for sleep.    [provider]    Family History Family History  Problem Relation Age of Onset   Heart attack Mother     Social History Social History   Tobacco Use   Smoking status: Former    Current packs/day: 0.00    Types: Cigarettes    Quit date: 1960    Years since quitting: 65.2    Smokeless tobacco: Never  Vaping Use   Vaping status: Never Used  Substance Use Topics   Alcohol use: No   Drug use: Never     Allergies   Influenza vaccines   Review of Systems Review of Systems As noted in HPI  Physical Exam Triage Vital Signs ED Triage Vitals  Encounter Vitals Group     BP 05/15/23 0913 (!) 163/84     Systolic BP Percentile --      Diastolic BP Percentile --      Pulse Rate 05/15/23 0913 75     Resp 05/15/23 0913 15     Temp 05/15/23 0913 (!) 97.3 F (36.3 C)     Temp Source 05/15/23 0913 Oral     SpO2 05/15/23 0913 97 %     Weight --      Height --      Head Circumference --      Peak Flow --      Pain Score 05/15/23 0912 0     Pain Loc --      Pain Education --      Exclude from Growth Chart --    No data found.  Updated Vital Signs BP (!) 163/84 (BP Location: Right Arm)   Pulse 75   Temp (!) 97.3 F (36.3 C) (Oral)   Resp 15   SpO2 97%   Visual Acuity Right Eye Distance:   Left Eye Distance:   Bilateral Distance:    Right Eye Near:   Left Eye Near:    Bilateral Near:     Physical Exam Physical Exam Vitals signs and nursing note reviewed.  Constitutional:      General: he is not in acute distress.    Appearance: Normal appearance. He is not ill-appearing, toxic-appearing or diaphoretic.  HENT:     Head: Normocephalic.     Right Ear: Tympanic membrane, ear canal and external ear normal.     Left Ear: Tympanic membrane, ear canal and external ear normal.     Nose: with mild congestion and clear mucous    Mouth/Throat: clear    Mouth: Mucous membranes are moist.  Eyes:     General: No scleral icterus.       Right eye: No discharge.        Left eye: No discharge.     Conjunctiva/sclera: Conjunctivae normal.  Neck:     Musculoskeletal: Neck supple. No neck rigidity.  Cardiovascular:     Rate and Rhythm: Normal rate and regular rhythm.     Heart sounds: No murmur.  Pulmonary:     Effort: Pulmonary effort is normal.      Breath sounds: Normal breath sounds.  Musculoskeletal: Normal range of motion.  Lymphadenopathy:     Cervical: No cervical adenopathy.  Skin:    General: Skin is warm and dry.     Coloration: Skin is not jaundiced.     Findings: No rash.  Neurological:     Mental Status: She is alert and oriented to person, place, and time.     Gait: Gait normal.  Psychiatric:        Mood and Affect: Mood normal.        Behavior: Behavior normal.        Thought Content: Thought content normal.        Judgment: Judgment normal.    UC Treatments / Results  Labs (all labs ordered are listed, but only abnormal results are displayed) Labs Reviewed - No data to display  EKG   Radiology No results found.  Procedures Procedures (including critical care time)  Medications Ordered in UC Medications - No data to display  Initial Impression / Assessment and Plan / UC Course  I have reviewed the triage vital signs and the nursing notes.  URI, possible rhinovirus  I showed him a video how to do saline rinses and advised him to do this bid I also placed him on Allegra and Flonase as noted. See instruction.   Final Clinical Impressions(s) / UC Diagnoses   Final diagnoses:  Upper respiratory tract infection, unspecified type     Discharge Instructions      I believe you have rhinovirus type of cold which causes the symptoms you have told me To help your immune system fight this take Vitamin C up to 5000 mg a day until the cold resolves,  Vitamin D3 5,000 I.U.international international Units a day  Zinc 30 mg a day   Do the saline rinses twice a day with distilled water or Boiled water, but avoid doing it before bed time. Do this for 5-7 days. This will clean out the mucous from your nose and the throat area.   If you develop, chills, sweats, fever please come back.      ED Prescriptions     Medication Sig Dispense Auth. Provider   fluticasone (FLONASE) 50 MCG/ACT nasal spray  Place 2 sprays into both nostrils daily. 18.2 mL Rodriguez-Southworth, Nettie Elm, PA-C   fexofenadine (ALLEGRA ALLERGY) 180 MG tablet Take 1 tablet (180 mg total) by mouth daily. 30 tablet Rodriguez-Southworth, Nettie Elm, PA-C      PDMP not reviewed this encounter.   Garey Ham, [place] 05/15/23 [phone]

## 2023-07-15 DIAGNOSIS — E559 Vitamin D deficiency, unspecified: Secondary | ICD-10-CM | POA: Diagnosis not present

## 2023-07-15 DIAGNOSIS — R7989 Other specified abnormal findings of blood chemistry: Secondary | ICD-10-CM | POA: Diagnosis not present

## 2023-07-15 DIAGNOSIS — F32A Depression, unspecified: Secondary | ICD-10-CM | POA: Diagnosis not present

## 2023-07-15 DIAGNOSIS — Z862 Personal history of diseases of the blood and blood-forming organs and certain disorders involving the immune mechanism: Secondary | ICD-10-CM | POA: Diagnosis not present

## 2023-07-15 DIAGNOSIS — I10 Essential (primary) hypertension: Secondary | ICD-10-CM | POA: Diagnosis not present

## 2023-07-15 DIAGNOSIS — G4733 Obstructive sleep apnea (adult) (pediatric): Secondary | ICD-10-CM | POA: Diagnosis not present

## 2023-07-15 DIAGNOSIS — R5383 Other fatigue: Secondary | ICD-10-CM | POA: Diagnosis not present

## 2023-07-15 DIAGNOSIS — K219 Gastro-esophageal reflux disease without esophagitis: Secondary | ICD-10-CM | POA: Diagnosis not present

## 2023-07-15 DIAGNOSIS — Z8679 Personal history of other diseases of the circulatory system: Secondary | ICD-10-CM | POA: Diagnosis not present

## 2023-07-15 DIAGNOSIS — F419 Anxiety disorder, unspecified: Secondary | ICD-10-CM | POA: Diagnosis not present

## 2023-07-17 DIAGNOSIS — G4733 Obstructive sleep apnea (adult) (pediatric): Secondary | ICD-10-CM | POA: Diagnosis not present

## 2023-07-17 DIAGNOSIS — I25119 Atherosclerotic heart disease of native coronary artery with unspecified angina pectoris: Secondary | ICD-10-CM | POA: Diagnosis not present

## 2023-07-17 DIAGNOSIS — R5383 Other fatigue: Secondary | ICD-10-CM | POA: Diagnosis not present

## 2023-07-17 DIAGNOSIS — R0609 Other forms of dyspnea: Secondary | ICD-10-CM | POA: Diagnosis not present

## 2023-07-17 DIAGNOSIS — E7849 Other hyperlipidemia: Secondary | ICD-10-CM | POA: Diagnosis not present

## 2023-07-17 DIAGNOSIS — Z9861 Coronary angioplasty status: Secondary | ICD-10-CM | POA: Diagnosis not present

## 2023-07-17 DIAGNOSIS — I10 Essential (primary) hypertension: Secondary | ICD-10-CM | POA: Diagnosis not present

## 2023-08-05 DIAGNOSIS — Z9861 Coronary angioplasty status: Secondary | ICD-10-CM | POA: Diagnosis not present

## 2023-08-05 DIAGNOSIS — I25119 Atherosclerotic heart disease of native coronary artery with unspecified angina pectoris: Secondary | ICD-10-CM | POA: Diagnosis not present

## 2023-08-05 DIAGNOSIS — R5383 Other fatigue: Secondary | ICD-10-CM | POA: Diagnosis not present

## 2023-08-05 DIAGNOSIS — R0609 Other forms of dyspnea: Secondary | ICD-10-CM | POA: Diagnosis not present

## 2023-08-05 DIAGNOSIS — I2584 Coronary atherosclerosis due to calcified coronary lesion: Secondary | ICD-10-CM | POA: Diagnosis not present

## 2023-08-05 DIAGNOSIS — I700 Atherosclerosis of aorta: Secondary | ICD-10-CM | POA: Diagnosis not present

## 2023-09-07 DIAGNOSIS — I493 Ventricular premature depolarization: Secondary | ICD-10-CM | POA: Diagnosis not present

## 2023-09-07 DIAGNOSIS — R0609 Other forms of dyspnea: Secondary | ICD-10-CM | POA: Diagnosis not present

## 2023-09-07 DIAGNOSIS — I10 Essential (primary) hypertension: Secondary | ICD-10-CM | POA: Diagnosis not present

## 2023-09-07 DIAGNOSIS — I25119 Atherosclerotic heart disease of native coronary artery with unspecified angina pectoris: Secondary | ICD-10-CM | POA: Diagnosis not present

## 2023-09-07 DIAGNOSIS — Z9861 Coronary angioplasty status: Secondary | ICD-10-CM | POA: Diagnosis not present

## 2023-09-07 DIAGNOSIS — R0602 Shortness of breath: Secondary | ICD-10-CM | POA: Diagnosis not present

## 2023-09-07 DIAGNOSIS — E7849 Other hyperlipidemia: Secondary | ICD-10-CM | POA: Diagnosis not present

## 2023-09-29 DIAGNOSIS — I493 Ventricular premature depolarization: Secondary | ICD-10-CM | POA: Diagnosis not present

## 2023-09-30 DIAGNOSIS — I493 Ventricular premature depolarization: Secondary | ICD-10-CM | POA: Diagnosis not present

## 2023-10-02 DIAGNOSIS — G4733 Obstructive sleep apnea (adult) (pediatric): Secondary | ICD-10-CM | POA: Diagnosis not present

## 2023-10-02 DIAGNOSIS — I493 Ventricular premature depolarization: Secondary | ICD-10-CM | POA: Diagnosis not present

## 2023-10-02 DIAGNOSIS — R5383 Other fatigue: Secondary | ICD-10-CM | POA: Diagnosis not present

## 2023-10-02 DIAGNOSIS — I10 Essential (primary) hypertension: Secondary | ICD-10-CM | POA: Diagnosis not present

## 2023-10-02 DIAGNOSIS — I25119 Atherosclerotic heart disease of native coronary artery with unspecified angina pectoris: Secondary | ICD-10-CM | POA: Diagnosis not present

## 2023-10-02 DIAGNOSIS — Z9861 Coronary angioplasty status: Secondary | ICD-10-CM | POA: Diagnosis not present

## 2023-12-08 DIAGNOSIS — R5383 Other fatigue: Secondary | ICD-10-CM | POA: Diagnosis not present

## 2023-12-08 DIAGNOSIS — I10 Essential (primary) hypertension: Secondary | ICD-10-CM | POA: Diagnosis not present

## 2023-12-08 DIAGNOSIS — Z0001 Encounter for general adult medical examination with abnormal findings: Secondary | ICD-10-CM | POA: Diagnosis not present

## 2023-12-08 DIAGNOSIS — G4733 Obstructive sleep apnea (adult) (pediatric): Secondary | ICD-10-CM | POA: Diagnosis not present

## 2023-12-08 DIAGNOSIS — Z1329 Encounter for screening for other suspected endocrine disorder: Secondary | ICD-10-CM | POA: Diagnosis not present

## 2023-12-08 DIAGNOSIS — R0600 Dyspnea, unspecified: Secondary | ICD-10-CM | POA: Diagnosis not present

## 2023-12-08 DIAGNOSIS — E785 Hyperlipidemia, unspecified: Secondary | ICD-10-CM | POA: Diagnosis not present

## 2023-12-08 DIAGNOSIS — Z131 Encounter for screening for diabetes mellitus: Secondary | ICD-10-CM | POA: Diagnosis not present

## 2023-12-08 DIAGNOSIS — Z9989 Dependence on other enabling machines and devices: Secondary | ICD-10-CM | POA: Diagnosis not present

## 2023-12-21 DIAGNOSIS — R0602 Shortness of breath: Secondary | ICD-10-CM | POA: Diagnosis not present

## 2023-12-21 DIAGNOSIS — G4730 Sleep apnea, unspecified: Secondary | ICD-10-CM | POA: Diagnosis not present

## 2023-12-22 DIAGNOSIS — R002 Palpitations: Secondary | ICD-10-CM | POA: Diagnosis not present

## 2023-12-22 DIAGNOSIS — E7849 Other hyperlipidemia: Secondary | ICD-10-CM | POA: Diagnosis not present

## 2023-12-22 DIAGNOSIS — G4733 Obstructive sleep apnea (adult) (pediatric): Secondary | ICD-10-CM | POA: Diagnosis not present

## 2023-12-22 DIAGNOSIS — I493 Ventricular premature depolarization: Secondary | ICD-10-CM | POA: Diagnosis not present

## 2023-12-22 DIAGNOSIS — I10 Essential (primary) hypertension: Secondary | ICD-10-CM | POA: Diagnosis not present

## 2023-12-22 DIAGNOSIS — Z9861 Coronary angioplasty status: Secondary | ICD-10-CM | POA: Diagnosis not present

## 2023-12-22 DIAGNOSIS — I25119 Atherosclerotic heart disease of native coronary artery with unspecified angina pectoris: Secondary | ICD-10-CM | POA: Diagnosis not present

## 2023-12-22 DIAGNOSIS — R5383 Other fatigue: Secondary | ICD-10-CM | POA: Diagnosis not present

## 2023-12-24 ENCOUNTER — Telehealth: Payer: Self-pay | Admitting: Internal Medicine

## 2023-12-24 NOTE — Telephone Encounter (Signed)
 Faxed new patient information to [facility] [phone]

## 2023-12-25 DIAGNOSIS — R0602 Shortness of breath: Secondary | ICD-10-CM | POA: Diagnosis not present

## 2024-01-04 ENCOUNTER — Ambulatory Visit: Payer: Self-pay | Admitting: Internal Medicine

## 2024-01-04 ENCOUNTER — Encounter: Payer: Self-pay | Admitting: Internal Medicine

## 2024-01-04 VITALS — BP 128/104 | HR 86 | Temp 98.3°F | Resp 16 | Ht 68.0 in | Wt 174.0 lb

## 2024-01-04 DIAGNOSIS — G4733 Obstructive sleep apnea (adult) (pediatric): Secondary | ICD-10-CM

## 2024-01-04 DIAGNOSIS — Z7189 Other specified counseling: Secondary | ICD-10-CM

## 2024-01-04 NOTE — Progress Notes (Signed)
 [facility] [address] [place], [place] [address]  Pulmonary Sleep Medicine   Office Visit Note  Patient Name: Nathan Daniels DOB: 12-30-1934 MRN [id number]  Date of Service: 01/04/2024  Complaints/HPI: He is here for evaluation of sleep apnea. Patient states he recently had a full pulmonary workup. He was diagnosed with OSA in 2022. He has been on CPAP since that time. He states he has been averaging 7 hours. He states he has been having strange dreams also and he cannot recall them. He states he moves around quite a lot at night. Patient states that dreams are very vivid. He also has had a cardiology workup done which was fine. PFT were done by [facility] and he was told they were fine. I reviewed his meds he states he is not taking ambien and also is not taking celexa. He states he has also been on metoprolol  Office Spirometry Results:     ROS  General: (-) fever, (-) chills, (-) night sweats, (-) weakness Skin: (-) rashes, (-) itching,. Eyes: (-) visual changes, (-) redness, (-) itching. Nose and Sinuses: (-) nasal stuffiness or itchiness, (-) postnasal drip, (-) nosebleeds, (-) sinus trouble. Mouth and Throat: (-) sore throat, (-) hoarseness. Neck: (-) swollen glands, (-) enlarged thyroid , (-) neck pain. Respiratory: - cough, (-) bloody sputum, - shortness of breath, - wheezing. Cardiovascular: - ankle swelling, (-) chest pain. Lymphatic: (-) lymph node enlargement. Neurologic: (-) numbness, (-) tingling. Psychiatric: (-) anxiety, (-) depression   Current Medication: Outpatient Encounter Medications as of 01/04/2024  Medication Sig   aspirin 81 MG chewable tablet Chew by mouth.   atorvastatin (LIPITOR) 40 MG tablet Take 40 mg by mouth daily.   citalopram (CELEXA) 20 MG tablet Take 20 mg by mouth daily.   clopidogrel (PLAVIX) 75 MG tablet Take 1 tablet by mouth daily.   fexofenadine  (ALLEGRA  ALLERGY) 180 MG tablet Take 1 tablet (180 mg total) by mouth daily.    fluticasone  (FLONASE ) 50 MCG/ACT nasal spray Place 2 sprays into both nostrils daily.   gabapentin (NEURONTIN) 300 MG capsule Take 300 mg by mouth 3 (three) times daily.   lisinopril (PRINIVIL,ZESTRIL) 10 MG tablet Take 10 mg by mouth daily.   metoprolol succinate (TOPROL-XL) 25 MG 24 hr tablet Take 25 mg by mouth daily.   Multiple Vitamin (MULTIVITAMIN) tablet Take 1 tablet by mouth daily.   pantoprazole (PROTONIX) 40 MG tablet Take 40 mg by mouth at bedtime.   zolpidem (AMBIEN) 5 MG tablet Take 5 mg by mouth at bedtime as needed for sleep.   No facility-administered encounter medications on file as of 01/04/2024.    Surgical History: Past Surgical History:  Procedure Laterality Date   CARDIAC CATHETERIZATION  2016   2 stents   cardiac stents     ECTROPION REPAIR Bilateral 05/16/2022   Procedure: REPAIR OF ECTROPION, EXTENSIVE BILATERAL;  Surgeon: Ashley Greig HERO, MD;  Location: [facility];  Service: Ophthalmology;  Laterality: Bilateral;  sleep apnea   FLEXIBLE SIGMOIDOSCOPY N/A 12/24/2022   Procedure: FLEXIBLE SIGMOIDOSCOPY;  Surgeon: Therisa Bi, MD;  Location: [facility] END[facility]COPY;  Service: Gastroenterology;  Laterality: N/A;   HERNIA REPAIR     PROSTATECTOMY  03/03/1997    Medical History: Past Medical History:  Diagnosis Date   Benign neoplasm of soft tissue 01/27/2023   Cancer (HCC)    Carpal tunnel syndrome 01/27/2023   Ganglion of joint 01/27/2023   Hypercholesteremia    Hypertension    Prostate cancer (HCC)  Sensorineural hearing loss, bilateral 01/27/2023   Shoulder joint pain 01/27/2023   Sleep apnea    CPAP   Tachycardia 07/16/2022   Wears dentures    full upper and lower   Wears hearing aid in both ears     Family History: Family History  Problem Relation Age of Onset   Heart attack Mother     Social History: Social History   Socioeconomic History   Marital status: Single    Spouse name: Not on file   Number of children: Not on file   Years  of education: Not on file   Highest education level: Not on file  Occupational History   Not on file  Tobacco Use   Smoking status: Former    Current packs/day: 0.00    Types: Cigarettes    Quit date: 1960    Years since quitting: 65.8   Smokeless tobacco: Never  Vaping Use   Vaping status: Never Used  Substance and Sexual Activity   Alcohol use: No   Drug use: Never   Sexual activity: Not on file  Other Topics Concern   Not on file  Social History Narrative   Not on file   Social Drivers of Health   Financial Resource Strain: Medium Risk (12/08/2023)   Received from [facility]   Overall Financial Resource Strain (CARDIA)    How hard is it for you to pay for the very basics like food, housing, medical care, and heating?: Somewhat hard  Food Insecurity: No Food Insecurity (12/08/2023)   Received from [facility]   Hunger Vital Sign    Within the past 12 months, you worried that your food would run out before you got the money to buy more.: Never true    Within the past 12 months, the food you bought just didn't last and you didn't have money to get more.: Never true  Transportation Needs: No Transportation Needs (12/08/2023)   Received from [facility] - Transportation    Lack of Transportation (Medical): No    Lack of Transportation (Non-Medical): No  Physical Activity: Inactive (03/06/2021)   Received from [facility]   Exercise Vital Sign    On average, how many days per week do you engage in moderate to strenuous exercise (like a brisk walk)?: 0 days    On average, how many minutes do you engage in exercise at this level?: 0 min  Stress: Not on file  Social Connections: Not on file  Intimate Partner Violence: Not on file    Vital Signs: Blood pressure (!) 128/104, pulse 86, temperature 98.3 F (36.8 C), resp. rate 16, height 5' 8 (1.727 m), weight 174 lb (78.9 kg), SpO2 98%.  Examination: General Appearance: The patient is well-developed,  well-nourished, and in no distress. Skin: Gross inspection of skin unremarkable. Head: normocephalic, no gross deformities. Eyes: no gross deformities noted. ENT: ears appear grossly normal no exudates. Neck: Supple. No thyromegaly. No LAD. Respiratory: no rhonchi noted. Cardiovascular: Normal S1 and S2 without murmur or rub. Extremities: No cyanosis. pulses are equal. Neurologic: Alert and oriented. No involuntary movements.  LABS: No results found for this or any previous visit (from the past 2160 hours).  Radiology: No results found.  No results found.  No results found.  Assessment and Plan: Patient Active Problem List   Diagnosis Date Noted   Blood on toilet paper 12/24/2022   Bilateral hearing loss 02/27/2021   Bilateral tinnitus 02/27/2021  Cancer of skin 02/27/2021   Carcinoma of prostate (HCC) 02/27/2021   Depression 02/27/2021   Encounter for fitting and adjustment of hearing aid 02/27/2021   Osteoarthritis 02/27/2021   Risk for falls 04/17/2016   Personal history of other malignant neoplasm of skin 07/17/2015   Chest discomfort 09/11/2014   Coronary artery disease with angina pectoris 05/18/2014   Anxiety 05/15/2014   Hyperlipidemia 09/23/2011   Hypertension, benign 09/23/2011   Insomnia 09/23/2011    1. OSA (obstructive sleep apnea) (Primary) He may have RBD as an explanation of his noturnal behavior however he has no bed partner to observe. He also does have OSA but appears to be controlled on CPAP - [facility] Sleep Study; Future  2. CPAP use counseling CPAP Counseling: had a lengthy discussion with the patient regarding the importance of PAP therapy in management of the sleep apnea. Patient appears to understand the risk factor reduction and also understands the risks associated with untreated sleep apnea.    General Counseling: I have discussed the findings of the evaluation and examination with Nathan Daniels.  I have also discussed any further diagnostic  evaluation thatmay be needed or ordered today. Nathan Daniels verbalizes understanding of the findings of todays visit. We also reviewed his medications today and discussed drug interactions and side effects including but not limited excessive drowsiness and altered mental states. We also discussed that there is always a risk not just to him but also people around him. he has been encouraged to call the office with any questions or concerns that should arise related to todays visit.  No orders of the defined types were placed in this encounter.    Time spent: 55  I have personally obtained a history, examined the patient, evaluated laboratory and imaging results, formulated the assessment and plan and placed orders.    Nathan DELENA Bathe, MD [facility] Sleep medicine

## 2024-01-04 NOTE — Patient Instructions (Signed)

## 2024-01-14 DIAGNOSIS — F419 Anxiety disorder, unspecified: Secondary | ICD-10-CM | POA: Diagnosis not present

## 2024-01-14 DIAGNOSIS — M5416 Radiculopathy, lumbar region: Secondary | ICD-10-CM | POA: Diagnosis not present

## 2024-01-14 DIAGNOSIS — E7439 Other disorders of intestinal carbohydrate absorption: Secondary | ICD-10-CM | POA: Diagnosis not present

## 2024-05-02 ENCOUNTER — Ambulatory Visit: Admitting: Internal Medicine
